# Patient Record
Sex: Female | Born: 1985 | Race: White | Hispanic: No | Marital: Married | Smoking: Never smoker
Health system: Southern US, Community
[De-identification: ages and names within clinical notes are randomized; demographics above are authoritative.]

## PROBLEM LIST (undated history)

## (undated) ENCOUNTER — Inpatient Hospital Stay (HOSPITAL_COMMUNITY): Payer: Self-pay

## (undated) DIAGNOSIS — F419 Anxiety disorder, unspecified: Secondary | ICD-10-CM

## (undated) DIAGNOSIS — F4310 Post-traumatic stress disorder, unspecified: Secondary | ICD-10-CM

## (undated) DIAGNOSIS — I10 Essential (primary) hypertension: Secondary | ICD-10-CM

## (undated) HISTORY — PX: TONSILLECTOMY: SUR1361

## (undated) HISTORY — PX: CHOLECYSTECTOMY: SHX55

## (undated) HISTORY — PX: APPENDECTOMY: SHX54

---

## 2012-10-01 ENCOUNTER — Encounter (HOSPITAL_COMMUNITY): Payer: Self-pay

## 2012-10-01 ENCOUNTER — Inpatient Hospital Stay (HOSPITAL_COMMUNITY)
Admission: AD | Admit: 2012-10-01 | Discharge: 2012-10-01 | Disposition: A | Discharge status: Home / Self Care | Payer: Medicaid - Out of State | Source: Ambulatory Visit | Attending: Obstetrics & Gynecology | Admitting: Obstetrics & Gynecology

## 2012-10-01 ENCOUNTER — Inpatient Hospital Stay (HOSPITAL_COMMUNITY): Payer: Medicaid - Out of State

## 2012-10-01 DIAGNOSIS — R1013 Epigastric pain: Secondary | ICD-10-CM | POA: Insufficient documentation

## 2012-10-01 DIAGNOSIS — O99891 Other specified diseases and conditions complicating pregnancy: Secondary | ICD-10-CM | POA: Insufficient documentation

## 2012-10-01 DIAGNOSIS — R1011 Right upper quadrant pain: Secondary | ICD-10-CM

## 2012-10-01 DIAGNOSIS — O210 Mild hyperemesis gravidarum: Secondary | ICD-10-CM | POA: Insufficient documentation

## 2012-10-01 LAB — COMPREHENSIVE METABOLIC PANEL
CO2: 24 mEq/L (ref 19–32)
Calcium: 10.3 mg/dL (ref 8.4–10.5)
Creatinine, Ser: 0.62 mg/dL (ref 0.50–1.10)
GFR calc Af Amer: >90 mL/min (ref >90)
GFR calc non Af Amer: >90 mL/min (ref >90)
Glucose, Bld: 92 mg/dL (ref 70–99)

## 2012-10-01 LAB — URINALYSIS, ROUTINE W REFLEX MICROSCOPIC
Bilirubin Urine: NEGATIVE
Hgb urine dipstick: NEGATIVE
Protein, ur: NEGATIVE mg/dL
Urobilinogen, UA: 0.2 mg/dL (ref 0.0–1.0)

## 2012-10-01 LAB — CBC
HCT: 32.9 % — ABNORMAL LOW (ref 36.0–46.0)
MCHC: 35.3 g/dL (ref 30.0–36.0)
MCV: 88 fL (ref 78.0–100.0)
RDW: 12.1 % (ref 11.5–15.5)

## 2012-10-01 LAB — URINE MICROSCOPIC-ADD ON

## 2012-10-01 LAB — LIPASE, BLOOD: Lipase: 27 U/L (ref 11–59)

## 2012-10-01 LAB — POCT PREGNANCY, URINE: Preg Test, Ur: POSITIVE — AB

## 2012-10-01 LAB — WET PREP, GENITAL
Clue Cells Wet Prep HPF POC: NONE SEEN
Trich, Wet Prep: NONE SEEN

## 2012-10-01 MED ORDER — GI COCKTAIL ~~LOC~~
30.0000 mL | Freq: Once | ORAL | Status: AC
  Administered 2012-10-01: 30 mL via ORAL
  Filled 2012-10-01: qty 30

## 2012-10-01 MED ORDER — HYDROMORPHONE HCL PF 1 MG/ML IJ SOLN
1.0000 mg | Freq: Once | INTRAMUSCULAR | Status: AC
  Administered 2012-10-01: 1 mg via INTRAMUSCULAR
  Filled 2012-10-01: qty 1

## 2012-10-01 MED ORDER — PROMETHAZINE HCL 25 MG PO TABS
12.5000 mg | ORAL_TABLET | Freq: Once | ORAL | Status: AC
  Administered 2012-10-01: 12.5 mg via ORAL
  Filled 2012-10-01: qty 1

## 2012-10-01 MED ORDER — HYDROCODONE-ACETAMINOPHEN 5-325 MG PO TABS: 1.0000 | ORAL_TABLET | Freq: Four times a day (QID) | ORAL | Status: DC | PRN

## 2012-10-01 NOTE — MAU Note (Signed)
Patient states she has had her first appointment with a MD in [place]. States she started having epigastric pain this am that has gotten worse and radiating into the back. Denies bleeding or discharge. Has had morning sickness that has gotten better. Some nausea with the pain, no vomiting.

## 2012-10-01 NOTE — MAU Note (Signed)
Pt presents with sharp upper abdominal pain since about 9 am.  Pt thought it was maybe indigestion but it was not relieved by tums.  Pt has not thrown up but is on phenergan and remains nauseous.  Pt states pain start in front of abdomen as sharp stabbing pain and wraps around to the back.  Pt able to keep food down but can not finish food.  Pt states pain "wraps around back like a candy cane."

## 2012-10-01 NOTE — MAU Provider Note (Signed)
History     CSN: [id number]  Arrival date and time: 10/01/12 1822   None     Chief Complaint  Patient presents with  . Abdominal Pain   HPI Comments: Gina Baxter 27 y.o. G2P1001 comes to MAU today for right upper quad pain in early pregnancy. She is 9 weeks and 4 days. She states it radiates around to her back. She has been taking tylenol with no help. The pain was intense enough that she had to leave her 43 YOs birthday party. Denies any bleeding, vaginal discharge. She has nausea no vomiting. She has had her new OB visit in [place].       Abdominal Pain Associated symptoms include nausea. Pertinent negatives include no constipation or vomiting.      No past medical history on file.  Past Surgical History  Procedure Laterality Date  . Tonsillectomy    . Appendectomy      No family history on file.  History  Substance Use Topics  . Smoking status: Not on file  . Smokeless tobacco: Not on file  . Alcohol Use: Not on file    Allergies:  Allergies  Allergen Reactions  . Bee Venom Swelling    swelling  . Silicone Swelling    No prescriptions prior to admission    Review of Systems  Constitutional: Negative.        Has pain  HENT: Negative.   Eyes: Negative.   Respiratory: Negative.   Cardiovascular: Negative.   Gastrointestinal: Positive for heartburn, nausea and abdominal pain. Negative for vomiting and constipation.  Genitourinary: Positive for flank pain.       Right flank  Musculoskeletal: Positive for back pain.  Skin: Negative.   Neurological: Negative.   Psychiatric/Behavioral: Negative.    Physical Exam   Blood pressure 132/85, pulse 106, temperature 98.5 F (36.9 C), temperature source Oral, resp. rate 20, height 5\' 6"  (1.676 m), weight 166 lb (75.297 kg), last menstrual period 07/27/2012, SpO2 100.00%.  Physical Exam  Constitutional: She is oriented to person, place, and time. She appears well-developed and well-nourished. She  appears distressed.  HENT:  Head: Normocephalic and atraumatic.  Eyes: Pupils are equal, round, and reactive to light.  Cardiovascular: Normal rate, regular rhythm and normal heart sounds.   Respiratory: Effort normal and breath sounds normal. No respiratory distress. She has no wheezes. She exhibits no tenderness.  GI: Soft. Bowel sounds are normal. There is tenderness.  Genitourinary: Vagina normal. No vaginal discharge found.  Musculoskeletal: Normal range of motion. She exhibits tenderness.  Neurological: She is alert and oriented to person, place, and time.  Skin: Skin is warm and dry.  Psychiatric: She has a normal mood and affect. Her behavior is normal. Judgment and thought content normal.    MAU Course  Procedures  MDM Dilaudid 1 mg IM now CBC, ABORh, U/S, Quant  Results for orders placed during the hospital encounter of 10/01/12 (from the past 24 hour(s))  URINALYSIS, ROUTINE W REFLEX MICROSCOPIC     Status: Abnormal   Collection Time    10/01/12  6:45 PM      Result Value Range   Color, Urine YELLOW  YELLOW   APPearance HAZY (*) CLEAR   Specific Gravity, Urine 1.015  1.005 - 1.030   pH 6.5  5.0 - 8.0   Glucose, UA NEGATIVE  NEGATIVE mg/dL   Hgb urine dipstick NEGATIVE  NEGATIVE   Bilirubin Urine NEGATIVE  NEGATIVE   Ketones, ur NEGATIVE  NEGATIVE mg/dL  Protein, ur NEGATIVE  NEGATIVE mg/dL   Urobilinogen, UA 0.2  0.0 - 1.0 mg/dL   Nitrite NEGATIVE  NEGATIVE   Leukocytes, UA SMALL (*) NEGATIVE  URINE MICROSCOPIC-ADD ON     Status: Abnormal   Collection Time    10/01/12  6:45 PM      Result Value Range   Squamous Epithelial / LPF FEW (*) RARE   WBC, UA 3-6  <3 WBC/hpf   RBC / HPF 0-2  <3 RBC/hpf   Bacteria, UA RARE  RARE   Urine-Other AMORPHOUS URATES/PHOSPHATES    POCT PREGNANCY, URINE     Status: Abnormal   Collection Time    10/01/12  6:48 PM      Result Value Range   Preg Test, Ur POSITIVE (*) NEGATIVE  WET PREP, GENITAL     Status: Abnormal    Collection Time    10/01/12  7:45 PM      Result Value Range   Yeast Wet Prep HPF POC NONE SEEN  NONE SEEN   Trich, Wet Prep NONE SEEN  NONE SEEN   Clue Cells Wet Prep HPF POC NONE SEEN  NONE SEEN   WBC, Wet Prep HPF POC FEW (*) NONE SEEN  CBC     Status: Abnormal   Collection Time    10/01/12  8:05 PM      Result Value Range   WBC 10.3  4.0 - 10.5 K/uL   RBC 3.74 (*) 3.87 - 5.11 MIL/uL   Hemoglobin 11.6 (*) 12.0 - 15.0 g/dL   HCT 16.1 (*) 09.6 - 04.5 %   MCV 88.0  78.0 - 100.0 fL   MCH 31.0  26.0 - 34.0 pg   MCHC 35.3  30.0 - 36.0 g/dL   RDW 40.9  81.1 - 91.4 %   Platelets 292  150 - 400 K/uL  HCG, QUANTITATIVE, PREGNANCY     Status: Abnormal   Collection Time    10/01/12  8:05 PM      Result Value Range   hCG, Beta Francene Finders [id number] (*) <5 mIU/mL  ABO/RH     Status: None   Collection Time    10/01/12  8:05 PM      Result Value Range   ABO/RH(D) O POS    COMPREHENSIVE METABOLIC PANEL     Status: Abnormal   Collection Time    10/01/12  8:05 PM      Result Value Range   Sodium 137  135 - 145 mEq/L   Potassium 4.2  3.5 - 5.1 mEq/L   Chloride 101  96 - 112 mEq/L   CO2 24  19 - 32 mEq/L   Glucose, Bld 92  70 - 99 mg/dL   BUN 15  6 - 23 mg/dL   Creatinine, Ser 2.13  0.50 - 1.10 mg/dL   Calcium 08.6  8.4 - 57.8 mg/dL   Total Protein 7.0  6.0 - 8.3 g/dL   Albumin 3.9  3.5 - 5.2 g/dL   AST 18  0 - 37 U/L   ALT 25  0 - 35 U/L   Alkaline Phosphatase 76  39 - 117 U/L   Total Bilirubin 0.1 (*) 0.3 - 1.2 mg/dL   GFR calc non Af Amer >90  >90 mL/min   GFR calc Af Amer >90  >90 mL/min  AMYLASE     Status: None   Collection Time    10/01/12  8:05 PM      Result Value  Range   Amylase 94  0 - 105 U/L  LIPASE, BLOOD     Status: None   Collection Time    10/01/12  8:05 PM      Result Value Range   Lipase 27  11 - 59 U/L   US Ob Comp Less 14 Wks  10/01/2012   CLINICAL DATA:  Right upper quadrant pain.  EXAM: OBSTETRIC <14 WK ULTRASOUND  TECHNIQUE: Transabdominal  ultrasound was performed for evaluation of the gestation as well as the maternal uterus and adnexal regions.  COMPARISON:  No priors.  FINDINGS: Intrauterine gestational sac: Single, ovoid in shape.  Yolk sac:  Present.  Embryo:  Present.  Cardiac Activity: Present.  Heart Rate: 169 bpm  CRL:   1.93  mm   8 w 4 d                  [place] EDC: 05/09/2013.  Maternal uterus/adnexae: No evidence to suggest subchorionic hemorrhage. No focal uterine lesions. Ovaries are normal in echotexture and appearance bilaterally. No significant free fluid in the cul-de-sac.  IMPRESSION: 1. Single viable IUP with estimated gestational age of [redacted] weeks and 4 days, and normal fetal heart rate of 169 beats per min. No acute findings.   Electronically Signed   By: Trudie Reed M.D.   On: 10/01/2012 21:44   C/W Dr. Despina Hidden, plan to FU with primary OBGYN and consider outpatient abdominal US as needed or if pain persists.    Assessment and Plan   A: . 1. RUQ abdominal pain      P:  Care transferred to Thressa Sheller, NMW at 8 pm D/C home with vicodin #15 0RF FU with primary OB as needed Return to MAU as needed 1st trimester precautions reviewed  Carolynn Serve 10/01/2012, 7:30 PM   Tawnya Crook

## 2013-08-06 ENCOUNTER — Encounter (HOSPITAL_COMMUNITY): Payer: Self-pay | Admitting: *Deleted

## 2013-11-19 ENCOUNTER — Encounter (HOSPITAL_COMMUNITY): Payer: Self-pay | Admitting: *Deleted

## 2015-04-26 DIAGNOSIS — F4310 Post-traumatic stress disorder, unspecified: Secondary | ICD-10-CM | POA: Diagnosis not present

## 2015-04-26 DIAGNOSIS — Z79899 Other long term (current) drug therapy: Secondary | ICD-10-CM | POA: Diagnosis not present

## 2015-04-26 DIAGNOSIS — R110 Nausea: Secondary | ICD-10-CM | POA: Diagnosis not present

## 2015-04-26 DIAGNOSIS — F419 Anxiety disorder, unspecified: Secondary | ICD-10-CM | POA: Diagnosis not present

## 2015-04-26 DIAGNOSIS — R509 Fever, unspecified: Secondary | ICD-10-CM | POA: Diagnosis not present

## 2015-04-26 DIAGNOSIS — F329 Major depressive disorder, single episode, unspecified: Secondary | ICD-10-CM | POA: Diagnosis not present

## 2015-04-26 DIAGNOSIS — N83201 Unspecified ovarian cyst, right side: Secondary | ICD-10-CM | POA: Diagnosis not present

## 2015-04-26 DIAGNOSIS — I10 Essential (primary) hypertension: Secondary | ICD-10-CM | POA: Diagnosis not present

## 2015-04-26 DIAGNOSIS — R1031 Right lower quadrant pain: Secondary | ICD-10-CM | POA: Diagnosis not present

## 2015-04-26 DIAGNOSIS — Z87891 Personal history of nicotine dependence: Secondary | ICD-10-CM | POA: Diagnosis not present

## 2015-04-27 DIAGNOSIS — R110 Nausea: Secondary | ICD-10-CM | POA: Diagnosis not present

## 2015-04-27 DIAGNOSIS — N83201 Unspecified ovarian cyst, right side: Secondary | ICD-10-CM | POA: Diagnosis not present

## 2015-04-27 DIAGNOSIS — R509 Fever, unspecified: Secondary | ICD-10-CM | POA: Diagnosis not present

## 2015-04-27 DIAGNOSIS — R1031 Right lower quadrant pain: Secondary | ICD-10-CM | POA: Diagnosis not present

## 2015-04-28 DIAGNOSIS — F411 Generalized anxiety disorder: Secondary | ICD-10-CM | POA: Diagnosis not present

## 2015-04-28 DIAGNOSIS — F331 Major depressive disorder, recurrent, moderate: Secondary | ICD-10-CM | POA: Diagnosis not present

## 2015-04-28 DIAGNOSIS — F428 Other obsessive-compulsive disorder: Secondary | ICD-10-CM | POA: Diagnosis not present

## 2015-04-28 DIAGNOSIS — F4001 Agoraphobia with panic disorder: Secondary | ICD-10-CM | POA: Diagnosis not present

## 2015-04-30 DIAGNOSIS — N83201 Unspecified ovarian cyst, right side: Secondary | ICD-10-CM | POA: Diagnosis not present

## 2015-05-09 DIAGNOSIS — R05 Cough: Secondary | ICD-10-CM | POA: Diagnosis not present

## 2015-05-09 DIAGNOSIS — J028 Acute pharyngitis due to other specified organisms: Secondary | ICD-10-CM | POA: Diagnosis not present

## 2015-05-22 DIAGNOSIS — J019 Acute sinusitis, unspecified: Secondary | ICD-10-CM | POA: Diagnosis not present

## 2015-05-22 DIAGNOSIS — M545 Low back pain: Secondary | ICD-10-CM | POA: Diagnosis not present

## 2015-06-08 DIAGNOSIS — J069 Acute upper respiratory infection, unspecified: Secondary | ICD-10-CM | POA: Diagnosis not present

## 2015-06-13 DIAGNOSIS — M5441 Lumbago with sciatica, right side: Secondary | ICD-10-CM | POA: Diagnosis not present

## 2015-07-07 DIAGNOSIS — I10 Essential (primary) hypertension: Secondary | ICD-10-CM | POA: Diagnosis not present

## 2015-07-07 DIAGNOSIS — J019 Acute sinusitis, unspecified: Secondary | ICD-10-CM | POA: Diagnosis not present

## 2015-07-14 DIAGNOSIS — R0600 Dyspnea, unspecified: Secondary | ICD-10-CM | POA: Diagnosis not present

## 2015-07-14 DIAGNOSIS — I10 Essential (primary) hypertension: Secondary | ICD-10-CM | POA: Diagnosis not present

## 2015-07-14 DIAGNOSIS — J9801 Acute bronchospasm: Secondary | ICD-10-CM | POA: Diagnosis not present

## 2015-07-14 DIAGNOSIS — J329 Chronic sinusitis, unspecified: Secondary | ICD-10-CM | POA: Diagnosis not present

## 2015-08-04 DIAGNOSIS — F411 Generalized anxiety disorder: Secondary | ICD-10-CM | POA: Diagnosis not present

## 2015-08-04 DIAGNOSIS — F428 Other obsessive-compulsive disorder: Secondary | ICD-10-CM | POA: Diagnosis not present

## 2015-08-04 DIAGNOSIS — F331 Major depressive disorder, recurrent, moderate: Secondary | ICD-10-CM | POA: Diagnosis not present

## 2015-08-04 DIAGNOSIS — F4001 Agoraphobia with panic disorder: Secondary | ICD-10-CM | POA: Diagnosis not present

## 2015-08-05 DIAGNOSIS — I10 Essential (primary) hypertension: Secondary | ICD-10-CM | POA: Diagnosis not present

## 2015-08-05 DIAGNOSIS — J0110 Acute frontal sinusitis, unspecified: Secondary | ICD-10-CM | POA: Diagnosis not present

## 2015-08-05 DIAGNOSIS — R05 Cough: Secondary | ICD-10-CM | POA: Diagnosis not present

## 2015-08-07 DIAGNOSIS — R21 Rash and other nonspecific skin eruption: Secondary | ICD-10-CM | POA: Diagnosis not present

## 2015-08-07 DIAGNOSIS — R030 Elevated blood-pressure reading, without diagnosis of hypertension: Secondary | ICD-10-CM | POA: Diagnosis not present

## 2015-08-07 DIAGNOSIS — L500 Allergic urticaria: Secondary | ICD-10-CM | POA: Diagnosis not present

## 2015-08-22 DIAGNOSIS — F4001 Agoraphobia with panic disorder: Secondary | ICD-10-CM | POA: Diagnosis not present

## 2015-08-22 DIAGNOSIS — F331 Major depressive disorder, recurrent, moderate: Secondary | ICD-10-CM | POA: Diagnosis not present

## 2015-08-22 DIAGNOSIS — F411 Generalized anxiety disorder: Secondary | ICD-10-CM | POA: Diagnosis not present

## 2015-08-22 DIAGNOSIS — F428 Other obsessive-compulsive disorder: Secondary | ICD-10-CM | POA: Diagnosis not present

## 2015-09-07 DIAGNOSIS — R05 Cough: Secondary | ICD-10-CM | POA: Diagnosis not present

## 2015-09-07 DIAGNOSIS — J019 Acute sinusitis, unspecified: Secondary | ICD-10-CM | POA: Diagnosis not present

## 2015-09-07 DIAGNOSIS — J3089 Other allergic rhinitis: Secondary | ICD-10-CM | POA: Diagnosis not present

## 2015-09-23 DIAGNOSIS — R509 Fever, unspecified: Secondary | ICD-10-CM | POA: Diagnosis not present

## 2015-09-23 DIAGNOSIS — R062 Wheezing: Secondary | ICD-10-CM | POA: Diagnosis not present

## 2015-09-23 DIAGNOSIS — J40 Bronchitis, not specified as acute or chronic: Secondary | ICD-10-CM | POA: Diagnosis not present

## 2015-09-23 DIAGNOSIS — R05 Cough: Secondary | ICD-10-CM | POA: Diagnosis not present

## 2015-10-28 DIAGNOSIS — F411 Generalized anxiety disorder: Secondary | ICD-10-CM | POA: Diagnosis not present

## 2015-10-28 DIAGNOSIS — F331 Major depressive disorder, recurrent, moderate: Secondary | ICD-10-CM | POA: Diagnosis not present

## 2015-10-28 DIAGNOSIS — F4001 Agoraphobia with panic disorder: Secondary | ICD-10-CM | POA: Diagnosis not present

## 2015-10-28 DIAGNOSIS — F428 Other obsessive-compulsive disorder: Secondary | ICD-10-CM | POA: Diagnosis not present

## 2015-11-01 ENCOUNTER — Encounter (HOSPITAL_BASED_OUTPATIENT_CLINIC_OR_DEPARTMENT_OTHER): Payer: Self-pay | Admitting: Emergency Medicine

## 2015-11-01 ENCOUNTER — Emergency Department (HOSPITAL_BASED_OUTPATIENT_CLINIC_OR_DEPARTMENT_OTHER)
Admission: EM | Admit: 2015-11-01 | Discharge: 2015-11-01 | Disposition: A | Discharge status: Home / Self Care | Payer: BLUE CROSS/BLUE SHIELD | Attending: Emergency Medicine | Admitting: Emergency Medicine

## 2015-11-01 DIAGNOSIS — Z23 Encounter for immunization: Secondary | ICD-10-CM | POA: Diagnosis not present

## 2015-11-01 DIAGNOSIS — Y999 Unspecified external cause status: Secondary | ICD-10-CM | POA: Diagnosis not present

## 2015-11-01 DIAGNOSIS — Y9389 Activity, other specified: Secondary | ICD-10-CM | POA: Diagnosis not present

## 2015-11-01 DIAGNOSIS — W260XXA Contact with knife, initial encounter: Secondary | ICD-10-CM | POA: Insufficient documentation

## 2015-11-01 DIAGNOSIS — S6992XA Unspecified injury of left wrist, hand and finger(s), initial encounter: Secondary | ICD-10-CM | POA: Diagnosis present

## 2015-11-01 DIAGNOSIS — Y929 Unspecified place or not applicable: Secondary | ICD-10-CM | POA: Diagnosis not present

## 2015-11-01 DIAGNOSIS — I10 Essential (primary) hypertension: Secondary | ICD-10-CM | POA: Insufficient documentation

## 2015-11-01 DIAGNOSIS — S61211A Laceration without foreign body of left index finger without damage to nail, initial encounter: Secondary | ICD-10-CM | POA: Diagnosis not present

## 2015-11-01 HISTORY — DX: Essential (primary) hypertension: I10

## 2015-11-01 HISTORY — DX: Anxiety disorder, unspecified: F41.9

## 2015-11-01 HISTORY — DX: Post-traumatic stress disorder, unspecified: F43.10

## 2015-11-01 MED ORDER — TETANUS-DIPHTH-ACELL PERTUSSIS 5-2.5-18.5 LF-MCG/0.5 IM SUSP
0.5000 mL | Freq: Once | INTRAMUSCULAR | Status: AC
  Administered 2015-11-01: 0.5 mL via INTRAMUSCULAR
  Filled 2015-11-01: qty 0.5

## 2015-11-01 MED ORDER — LIDOCAINE HCL (PF) 1 % IJ SOLN
5.0000 mL | Freq: Once | INTRAMUSCULAR | Status: AC
  Administered 2015-11-01: 5 mL via INTRADERMAL
  Filled 2015-11-01: qty 5

## 2015-11-01 NOTE — ED Provider Notes (Signed)
[facility] Provider Note   CSN: [id number] Arrival date & time: 11/01/15  0021     History   Chief Complaint Chief Complaint  Patient presents with  . Laceration    HPI Gina Baxter is a 30 y.o. female.  The history is provided by the patient and medical records.  Laceration     30 y.o. F with hx of HTN and anxiety, presenting to the ED for left index finger laceration from knife.  Patient states she was cutting up food when the knife slipped and accidentally cut her finger.  Denies any numbness or tingling of affected finger. States she can still flex and extend her finger without difficulty, however it is painful. Patient is right hand dominant.  Date of last tetanus unknown.  Past Medical History:  Diagnosis Date  . Anxiety   . Hypertension   . PTSD (post-traumatic stress disorder)     There are no active problems to display for this patient.   Past Surgical History:  Procedure Laterality Date  . APPENDECTOMY    . CHOLECYSTECTOMY    . TONSILLECTOMY      OB History    Gravida Para Term Preterm AB Living   2 1 1  0 0 1   SAB TAB Ectopic Multiple Live Births   0 0 0 0         Home Medications    Prior to Admission medications   Medication Sig Start Date End Date Taking? Authorizing Provider  HYDROcodone-acetaminophen (NORCO/VICODIN) 5-325 MG per tablet Take 1-2 tablets by mouth every 6 (six) hours as needed for pain. 10/01/12   Armando ReichertHeather D Hogan, CNM    Family History No family history on file.  Social History Social History  Substance Use Topics  . Smoking status: Never Smoker  . Smokeless tobacco: Never Used  . Alcohol use Yes     Allergies   Bee venom and Silicone   Review of Systems Review of Systems  Skin: Positive for wound.  All other systems reviewed and are negative.    Physical Exam Updated Vital Signs BP (!) 158/103 (BP Location: Right Arm)   Pulse 86   Temp 97.9 F (36.6 C) (Oral)   Resp 18   Ht 5\' 7"  (1.702  m)   Wt 77.1 kg   SpO2 100%   BMI 26.63 kg/m   Physical Exam  Constitutional: She is oriented to person, place, and time. She appears well-developed and well-nourished.  HENT:  Head: Normocephalic and atraumatic.  Mouth/Throat: Oropharynx is clear and moist.  Eyes: Conjunctivae and EOM are normal. Pupils are equal, round, and reactive to light.  Neck: Normal range of motion.  Cardiovascular: Normal rate, regular rhythm and normal heart sounds.   Pulmonary/Chest: Effort normal and breath sounds normal.  Abdominal: Soft. Bowel sounds are normal.  Musculoskeletal: Normal range of motion.  1.5 cm laceration to pad of distal left index finger; wound is overall hemostatic; full flexion/extension of finger maintained; no swelling or bony deformities; normal sensation, normal cap refill  Neurological: She is alert and oriented to person, place, and time.  Skin: Skin is warm and dry.  Psychiatric: She has a normal mood and affect.  Nursing note and vitals reviewed.    ED Treatments / Results  Labs (all labs ordered are listed, but only abnormal results are displayed) Labs Reviewed - No data to display  EKG  EKG Interpretation None       Radiology No results found.  Procedures  Procedures (including critical care time)  LACERATION REPAIR Performed by: Garlon Hatchet Authorized by: Garlon Hatchet Consent: Verbal consent obtained. Risks and benefits: risks, benefits and alternatives were discussed Consent given by: patient Patient identity confirmed: provided demographic data Prepped and Draped in normal sterile fashion Wound explored  Laceration Location: left index finger  Laceration Length: 1.5 cm  No Foreign Bodies seen or palpated  Anesthesia: local infiltration  Local anesthetic: lidocaine 1% without epinephrine  Anesthetic total: 3 ml  Irrigation method: syringe Amount of cleaning: standard  Skin closure: 4-0 prolene  Number of sutures:  2  Technique: simple interrupted  Patient tolerance: Patient tolerated the procedure well with no immediate complications.   Medications Ordered in ED Medications - No data to display   Initial Impression / Assessment and Plan / ED Course  I have reviewed the triage vital signs and the nursing notes.  Pertinent labs & imaging results that were available during my care of the patient were reviewed by me and considered in my medical decision making (see chart for details).  Clinical Course   30 year old female here with laceration to left index finger from a steak knife. Bleeding is controlled on arrival. She maintains full flexion and extension of affected finger without any apparent tendon injury. She has normal sensation and cap refill. Tetanus updated. Laceration repaired as above, patient tolerated well. Discussed home wound care. She will follow-up in one week for suture removal.  Discussed plan with patient, she acknowledged understanding and agreed with plan of care.  Return precautions given for new or worsening symptoms.  Final Clinical Impressions(s) / ED Diagnoses   Final diagnoses:  Laceration of left index finger without foreign body without damage to nail, initial encounter    New Prescriptions New Prescriptions   No medications on file     Garlon Hatchet, PA-C 11/01/15 0058    Layla Maw Ward, DO 11/01/15 1610

## 2015-11-01 NOTE — ED Notes (Signed)
PA at bedside.

## 2015-11-01 NOTE — ED Notes (Signed)
PA at bedside for lac repair.

## 2015-11-01 NOTE — ED Triage Notes (Signed)
Pt has laceration to left index finger from steak knife.

## 2015-11-01 NOTE — Discharge Instructions (Signed)
Keep sutures clean and dry. Follow-up for suture removal in 1 week. Return here for any new concerns.

## 2015-11-25 DIAGNOSIS — F411 Generalized anxiety disorder: Secondary | ICD-10-CM | POA: Diagnosis not present

## 2015-11-25 DIAGNOSIS — F428 Other obsessive-compulsive disorder: Secondary | ICD-10-CM | POA: Diagnosis not present

## 2015-11-25 DIAGNOSIS — F4001 Agoraphobia with panic disorder: Secondary | ICD-10-CM | POA: Diagnosis not present

## 2015-11-25 DIAGNOSIS — F331 Major depressive disorder, recurrent, moderate: Secondary | ICD-10-CM | POA: Diagnosis not present

## 2015-12-08 DIAGNOSIS — H6690 Otitis media, unspecified, unspecified ear: Secondary | ICD-10-CM | POA: Diagnosis not present

## 2015-12-08 DIAGNOSIS — J209 Acute bronchitis, unspecified: Secondary | ICD-10-CM | POA: Diagnosis not present

## 2015-12-08 DIAGNOSIS — I10 Essential (primary) hypertension: Secondary | ICD-10-CM | POA: Diagnosis not present

## 2015-12-18 DIAGNOSIS — F428 Other obsessive-compulsive disorder: Secondary | ICD-10-CM | POA: Diagnosis not present

## 2015-12-18 DIAGNOSIS — F411 Generalized anxiety disorder: Secondary | ICD-10-CM | POA: Diagnosis not present

## 2015-12-18 DIAGNOSIS — F4001 Agoraphobia with panic disorder: Secondary | ICD-10-CM | POA: Diagnosis not present

## 2015-12-18 DIAGNOSIS — F331 Major depressive disorder, recurrent, moderate: Secondary | ICD-10-CM | POA: Diagnosis not present

## 2016-01-05 DIAGNOSIS — F331 Major depressive disorder, recurrent, moderate: Secondary | ICD-10-CM | POA: Diagnosis not present

## 2016-01-05 DIAGNOSIS — F411 Generalized anxiety disorder: Secondary | ICD-10-CM | POA: Diagnosis not present

## 2016-01-05 DIAGNOSIS — F4001 Agoraphobia with panic disorder: Secondary | ICD-10-CM | POA: Diagnosis not present

## 2016-01-05 DIAGNOSIS — F428 Other obsessive-compulsive disorder: Secondary | ICD-10-CM | POA: Diagnosis not present

## 2016-02-07 ENCOUNTER — Emergency Department (HOSPITAL_BASED_OUTPATIENT_CLINIC_OR_DEPARTMENT_OTHER)
Admission: EM | Admit: 2016-02-07 | Discharge: 2016-02-08 | Disposition: A | Discharge status: Home / Self Care | Payer: BLUE CROSS/BLUE SHIELD | Attending: Emergency Medicine | Admitting: Emergency Medicine

## 2016-02-07 ENCOUNTER — Emergency Department (HOSPITAL_BASED_OUTPATIENT_CLINIC_OR_DEPARTMENT_OTHER): Payer: BLUE CROSS/BLUE SHIELD

## 2016-02-07 ENCOUNTER — Encounter (HOSPITAL_BASED_OUTPATIENT_CLINIC_OR_DEPARTMENT_OTHER): Payer: Self-pay | Admitting: Emergency Medicine

## 2016-02-07 DIAGNOSIS — I10 Essential (primary) hypertension: Secondary | ICD-10-CM | POA: Insufficient documentation

## 2016-02-07 DIAGNOSIS — K219 Gastro-esophageal reflux disease without esophagitis: Secondary | ICD-10-CM | POA: Diagnosis not present

## 2016-02-07 DIAGNOSIS — F419 Anxiety disorder, unspecified: Secondary | ICD-10-CM | POA: Diagnosis not present

## 2016-02-07 DIAGNOSIS — R079 Chest pain, unspecified: Secondary | ICD-10-CM | POA: Diagnosis not present

## 2016-02-07 DIAGNOSIS — F411 Generalized anxiety disorder: Secondary | ICD-10-CM | POA: Diagnosis not present

## 2016-02-07 DIAGNOSIS — Z79899 Other long term (current) drug therapy: Secondary | ICD-10-CM | POA: Diagnosis not present

## 2016-02-07 DIAGNOSIS — R072 Precordial pain: Secondary | ICD-10-CM | POA: Diagnosis present

## 2016-02-07 LAB — CBC
HCT: 34.8 % — ABNORMAL LOW (ref 36.0–46.0)
Hemoglobin: 11.4 g/dL — ABNORMAL LOW (ref 12.0–15.0)
MCH: 30.9 pg (ref 26.0–34.0)
MCHC: 32.8 g/dL (ref 30.0–36.0)
MCV: 94.3 fL (ref 78.0–100.0)
PLATELETS: 416 10*3/uL — AB (ref 150–400)
RBC: 3.69 MIL/uL — AB (ref 3.87–5.11)
RDW: 12.9 % (ref 11.5–15.5)
WBC: 7.6 10*3/uL (ref 4.0–10.5)

## 2016-02-07 LAB — BASIC METABOLIC PANEL
Anion gap: 4 — ABNORMAL LOW (ref 5–15)
BUN: 19 mg/dL (ref 6–20)
CALCIUM: 9.3 mg/dL (ref 8.9–10.3)
CHLORIDE: 107 mmol/L (ref 101–111)
CO2: 23 mmol/L (ref 22–32)
CREATININE: 0.63 mg/dL (ref 0.44–1.00)
GFR calc Af Amer: >60 mL/min (ref >60)
GFR calc non Af Amer: >60 mL/min (ref >60)
Glucose, Bld: 97 mg/dL (ref 65–99)
Potassium: 3.7 mmol/L (ref 3.5–5.1)
SODIUM: 134 mmol/L — AB (ref 135–145)

## 2016-02-07 LAB — TROPONIN I: Troponin I: <0.03 ng/mL (ref <0.03)

## 2016-02-07 MED ORDER — GI COCKTAIL ~~LOC~~
30.0000 mL | Freq: Once | ORAL | Status: AC
  Administered 2016-02-07: 30 mL via ORAL
  Filled 2016-02-07: qty 30

## 2016-02-07 NOTE — ED Provider Notes (Signed)
[facility] Note   CSN: [id number] Arrival date & time: 02/07/16  1900  By signing my name below, I, Doreatha Martin, attest that this documentation has been prepared under the direction and in the presence of Samarth Ogle, MD. Electronically Signed: Doreatha Martin, ED Scribe. 02/07/16. 11:46 PM.     History   Chief Complaint Chief Complaint  Patient presents with  . Chest Pain    HPI Gina Baxter is a 31 y.o. female who presents to the Emergency Department complaining of moderate, burning, sharp substernal and epigastric chest pain that began this evening. Pt states she was getting ready for dinner when she suddenly began to have intermittent sharp pains in her mid chest, subsequently becoming anxious. No alleviating factors noted. She states drinking fluids worsens her pain. Pt states the last thing she ate before her pain began was a chicken casserole that did not contain any cheese or heavy sauces. No recent long travel or surgeries. No h/o cardiac disease. She denies SOB, additional complaints or concerns.   The history is provided by the patient. No language interpreter was used.  Chest Pain   This is a new problem. The current episode started 3 to 5 hours ago. The problem occurs constantly. The problem has not changed since onset.The pain is associated with an emotional upset. The pain is present in the substernal region. The pain is moderate. The quality of the pain is described as burning and sharp. The pain does not radiate. Pertinent negatives include no abdominal pain, no back pain, no cough, no diaphoresis, no palpitations and no shortness of breath. She has tried nothing for the symptoms. The treatment provided no relief. There are no known risk factors.  Her past medical history is significant for anxiety/panic attacks.  Pertinent negatives for past medical history include no congenital heart disease, no CHF, no sickle cell disease, no spontaneous pneumothorax  and no valve disorder.  Pertinent negatives for family medical history include: no aortic dissection.  Procedure history is negative for cardiac catheterization.    Past Medical History:  Diagnosis Date  . Anxiety   . Hypertension   . PTSD (post-traumatic stress disorder)     There are no active problems to display for this patient.   Past Surgical History:  Procedure Laterality Date  . APPENDECTOMY    . CHOLECYSTECTOMY    . TONSILLECTOMY      OB History    Gravida Para Term Preterm AB Living   2 1 1  0 0 1   SAB TAB Ectopic Multiple Live Births   0 0 0 0         Home Medications    Prior to Admission medications   Medication Sig Start Date End Date Taking? Authorizing Provider  amphetamine-dextroamphetamine (ADDERALL) 20 MG tablet Take 20 mg by mouth daily.   Yes Historical Provider, MD  busPIRone (BUSPAR) 30 MG tablet Take 30 mg by mouth 2 (two) times daily.   Yes Historical Provider, MD  FLUoxetine (PROZAC) 40 MG capsule Take 40 mg by mouth daily.   Yes Historical Provider, MD  olmesartan (BENICAR) 20 MG tablet Take 20 mg by mouth daily.   Yes Historical Provider, MD  HYDROcodone-acetaminophen (NORCO/VICODIN) 5-325 MG per tablet Take 1-2 tablets by mouth every 6 (six) hours as needed for pain. 10/01/12   Armando Reichert, CNM    Family History History reviewed. No pertinent family history.  Social History Social History  Substance Use Topics  . Smoking  status: Never Smoker  . Smokeless tobacco: Never Used  . Alcohol use Yes     Allergies   Bee venom and Silicone   Review of Systems Review of Systems  Constitutional: Negative for diaphoresis.  Respiratory: Negative for cough, chest tightness and shortness of breath.   Cardiovascular: Positive for chest pain. Negative for palpitations and leg swelling.  Gastrointestinal: Negative for abdominal pain.  Musculoskeletal: Negative for back pain.  Psychiatric/Behavioral: The patient is nervous/anxious.   All  other systems reviewed and are negative.    Physical Exam Updated Vital Signs BP (!) 156/109   Pulse 89   Temp 98 F (36.7 C) (Oral)   Resp 18   Ht 5\' 7"  (1.702 m)   Wt 175 lb (79.4 kg)   LMP 01/24/2016   SpO2 98%   BMI 27.41 kg/m   Physical Exam  Constitutional: She is oriented to person, place, and time. She appears well-developed and well-nourished.  HENT:  Head: Normocephalic and atraumatic.  Mouth/Throat: Oropharynx is clear and moist. No oropharyngeal exudate.  Moist mucous membranes. No exudates.   Eyes: Conjunctivae and EOM are normal. Pupils are equal, round, and reactive to light.  Neck: Normal range of motion. Neck supple. No JVD present. No tracheal deviation present.  No carotid bruits. Trachea midline.   Cardiovascular: Normal rate, regular rhythm, normal heart sounds and intact distal pulses.  Exam reveals no gallop and no friction rub.   No murmur heard. RRR.   Pulmonary/Chest: Effort normal and breath sounds normal. No stridor. No respiratory distress. She has no wheezes. She has no rales.  Lungs CTA bilaterally.   Abdominal: Soft. She exhibits no distension. There is no rebound and no guarding.  Extremely gassy with bowel sounds referred into thoracic cavity.   Musculoskeletal: Normal range of motion. She exhibits no edema.  All compartments soft. No edema. No cords.   Lymphadenopathy:    She has no cervical adenopathy.  Neurological: She is alert and oriented to person, place, and time. She has normal reflexes.  Skin: Skin is warm and dry. Capillary refill takes less than 2 seconds.  Psychiatric: Her mood appears anxious.  Pt is anxious, tearful and crying in the room   Nursing note and vitals reviewed.    ED Treatments / Results   DIAGNOSTIC STUDIES: Oxygen Saturation is 97% on RA, normal by my interpretation.    COORDINATION OF CARE: 11:14 PM Discussed treatment plan with pt at bedside which includes CXR, lab work and pt agreed to plan.     Labs (all labs ordered are listed, but only abnormal  Results for orders placed or performed during the hospital encounter of 02/07/16  Basic metabolic panel  Result Value Ref Range   Sodium 134 (L) 135 - 145 mmol/L   Potassium 3.7 3.5 - 5.1 mmol/L   Chloride 107 101 - 111 mmol/L   CO2 23 22 - 32 mmol/L   Glucose, Bld 97 65 - 99 mg/dL   BUN 19 6 - 20 mg/dL   Creatinine, Ser 4.090.63 0.44 - 1.00 mg/dL   Calcium 9.3 8.9 - 81.110.3 mg/dL   GFR calc non Af Amer >60 >60 mL/min   GFR calc Af Amer >60 >60 mL/min   Anion gap 4 (L) 5 - 15  CBC  Result Value Ref Range   WBC 7.6 4.0 - 10.5 K/uL   RBC 3.69 (L) 3.87 - 5.11 MIL/uL   Hemoglobin 11.4 (L) 12.0 - 15.0 g/dL   HCT 91.434.8 (L)  36.0 - 46.0 %   MCV 94.3 78.0 - 100.0 fL   MCH 30.9 26.0 - 34.0 pg   MCHC 32.8 30.0 - 36.0 g/dL   RDW 16.1 09.6 - 04.5 %   Platelets 416 (H) 150 - 400 K/uL  Troponin I  Result Value Ref Range   Troponin I <0.03 <0.03 ng/mL   Dg Chest 2 View  Result Date: 02/07/2016 CLINICAL DATA:  Acute onset stabbing chest pain at 1700 hrs today. Slight headache. Ex smoker. Htn. No diab. Hx bronchitis. EXAM: CHEST  2 VIEW COMPARISON:  None. FINDINGS: Normal mediastinum and cardiac silhouette. Normal pulmonary vasculature. No evidence of effusion, infiltrate, or pneumothorax. No acute bony abnormality. IMPRESSION: No acute cardiopulmonary process. Electronically Signed   By: Genevive Bi M.D.   On: 02/07/2016 20:26   EKG  EKG Interpretation  Date/Time:  Saturday February 07 2016 19:17:49 EST Ventricular Rate:  89 PR Interval:  134 QRS Duration: 82 QT Interval:  346 QTC Calculation: 420 R Axis:   16 Text Interpretation:  Normal sinus rhythm Normal ECG No old tracing to compare Confirmed by BELFI  MD, MELANIE ([id number]) on 02/07/2016 8:57:08 PM       Radiology Dg Chest 2 View  Result Date: 02/07/2016 CLINICAL DATA:  Acute onset stabbing chest pain at 1700 hrs today. Slight headache. Ex smoker. Htn. No diab. Hx bronchitis.  EXAM: CHEST  2 VIEW COMPARISON:  None. FINDINGS: Normal mediastinum and cardiac silhouette. Normal pulmonary vasculature. No evidence of effusion, infiltrate, or pneumothorax. No acute bony abnormality. IMPRESSION: No acute cardiopulmonary process. Electronically Signed   By: Genevive Bi M.D.   On: 02/07/2016 20:26    Procedures Procedures (including critical care time)  Medications Ordered in ED Medications  gi cocktail (Maalox,Lidocaine,Donnatal) (30 mLs Oral Given 02/07/16 2317)  LORazepam (ATIVAN) tablet 0.5 mg (0.5 mg Oral Given 02/08/16 0050)  ketorolac (TORADOL) injection 60 mg (60 mg Intramuscular Given 02/08/16 0050)       Anxiety.  Symptoms resolved with ativan. PERC negative. Wells zero. Highly doubt PE in this very low risk patient.  HEART score is 0. All questions answered to patient's satisfaction. Based on history and exam patient has been appropriately medically screened and emergency conditions excluded. Patient is stable for discharge at this time. Strict return precautions given for any further episodes, persistent fever, weakness or any concerns.  New Prescriptions New Prescriptions   No medications on file   I personally performed the services described in this documentation, which was scribed in my presence. The recorded information has been reviewed and is accurate.      Cy Blamer, MD 02/08/16 [phone]

## 2016-02-07 NOTE — ED Triage Notes (Signed)
Patient states that she was fixing her hair to go out tonight and she started to have chest pain in her midsternal chest. The patient is very anxious and tearful in triage. The patient reports that taking a deep breath hurts as well as sitting still.

## 2016-02-08 ENCOUNTER — Encounter (HOSPITAL_BASED_OUTPATIENT_CLINIC_OR_DEPARTMENT_OTHER): Payer: Self-pay | Admitting: Emergency Medicine

## 2016-02-08 MED ORDER — LORAZEPAM 1 MG PO TABS
0.5000 mg | ORAL_TABLET | Freq: Once | ORAL | Status: AC
  Administered 2016-02-08: 0.5 mg via ORAL
  Filled 2016-02-08: qty 1

## 2016-02-08 MED ORDER — KETOROLAC TROMETHAMINE 60 MG/2ML IM SOLN
60.0000 mg | Freq: Once | INTRAMUSCULAR | Status: AC
  Administered 2016-02-08: 60 mg via INTRAMUSCULAR
  Filled 2016-02-08: qty 2

## 2016-02-08 NOTE — ED Notes (Signed)
Pt given d/c instructions as per chart. Verbalizes understanding. No questions. 

## 2016-03-10 DIAGNOSIS — F411 Generalized anxiety disorder: Secondary | ICD-10-CM | POA: Diagnosis not present

## 2016-03-10 DIAGNOSIS — F3342 Major depressive disorder, recurrent, in full remission: Secondary | ICD-10-CM | POA: Diagnosis not present

## 2016-03-10 DIAGNOSIS — F900 Attention-deficit hyperactivity disorder, predominantly inattentive type: Secondary | ICD-10-CM | POA: Diagnosis not present

## 2016-03-18 ENCOUNTER — Encounter: Payer: Self-pay | Admitting: Family Medicine

## 2016-03-18 ENCOUNTER — Ambulatory Visit (INDEPENDENT_AMBULATORY_CARE_PROVIDER_SITE_OTHER): Payer: BLUE CROSS/BLUE SHIELD | Admitting: Family Medicine

## 2016-03-18 DIAGNOSIS — I10 Essential (primary) hypertension: Secondary | ICD-10-CM

## 2016-03-18 DIAGNOSIS — Z7689 Persons encountering health services in other specified circumstances: Secondary | ICD-10-CM | POA: Diagnosis not present

## 2016-03-18 DIAGNOSIS — F4310 Post-traumatic stress disorder, unspecified: Secondary | ICD-10-CM | POA: Insufficient documentation

## 2016-03-18 DIAGNOSIS — M25511 Pain in right shoulder: Secondary | ICD-10-CM | POA: Insufficient documentation

## 2016-03-18 DIAGNOSIS — F329 Major depressive disorder, single episode, unspecified: Secondary | ICD-10-CM

## 2016-03-18 DIAGNOSIS — F32A Depression, unspecified: Secondary | ICD-10-CM

## 2016-03-18 MED ORDER — PREDNISONE 10 MG PO TABS: ORAL_TABLET | ORAL | 0 refills | Status: DC

## 2016-03-18 NOTE — Patient Instructions (Signed)
It was a pleasure meeting you today! Please take medication as directed with food and follow up for a physical and lab work at your convenience.  You have been referred to the sports medicine physician for evaluation and treatment of your shoulder pain.   You will be contacted about your referral.  Please let us know if you have not heard back within 1 week about your referral.   Please continue to monitor your salt and blood pressure as we discussed. Bring your blood pressure readings with you to your next visit. Also, please report blood pressure average >150/90.  Shoulder Pain Many things can cause shoulder pain, including:  An injury to the area.  Overuse of the shoulder.  Arthritis. The source of the pain can be:  Inflammation.  An injury to the shoulder joint.  An injury to a tendon, ligament, or bone. Follow these instructions at home: Take these actions to help with your pain:  Squeeze a soft ball or a foam pad as much as possible. This helps to keep the shoulder from swelling. It also helps to strengthen the arm.  Take over-the-counter and prescription medicines only as told by your health care provider.  If directed, apply ice to the area:  Put ice in a plastic bag.  Place a towel between your skin and the bag.  Leave the ice on for 20 minutes, 2-3 times per day. Stop applying ice if it does not help with the pain.  If you were given a shoulder sling or immobilizer:  Wear it as told.  Remove it to shower or bathe.  Move your arm as little as possible, but keep your hand moving to prevent swelling. Contact a health care provider if:  Your pain gets worse.  Your pain is not relieved with medicines.  New pain develops in your arm, hand, or fingers. Get help right away if:  Your arm, hand, or fingers:  Tingle.  Become numb.  Become swollen.  Become painful.  Turn white or blue. This information is not intended to replace advice given to you by  your health care provider. Make sure you discuss any questions you have with your health care provider. Document Released: 10/14/2004 Document Revised: 08/31/2015 Document Reviewed: 04/29/2014 Elsevier Interactive Patient Education  2017 [employer]
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
## 2016-03-28 NOTE — Progress Notes (Deleted)
[name] [address] [place], [place] [address] Phone: [phone] Subjective:    I'm seeing this patient by the request  of:  Inez Catalina, FNP   CC: right shoulder pain  [id number]  Gina Baxter is a 31 y.o. female coming in with complaint of right shoulder pain. Impingement having this on and off for approximately 1 year. States that unfortunately over the course last 6 weeks it's been getting worse. Patient states that the pain seems to stay localized to the shoulder and seems to be worse with certain movements. Patient is trying over-the-counter anti-inflammatories with very minimal benefit. Patient has discussed this with the physical therapist previously and has tried some exercises. Ice and heat didn't give some temporary relief. He does state the pain gets better with rest as well. 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     Past Medical History:  Diagnosis Date  . Anxiety   . Hypertension   . PTSD (post-traumatic stress disorder)    Past Surgical History:  Procedure Laterality Date  . APPENDECTOMY    . CHOLECYSTECTOMY    . TONSILLECTOMY     Social History   Social History  . Marital status: Married    Spouse name: N/A  . Number of children: N/A  . Years of education: N/A   Social History Main Topics  . Smoking status: Never Smoker  . Smokeless tobacco: Never Used  . Alcohol use 1.2 oz/week    2 Glasses of wine per week  . Drug use: No  . Sexual activity: Yes    Partners: Male    Birth control/ protection: Condom   Other Topics Concern  . Not on file   Social History Narrative   Works as a Engineer, civil (consulting) in a rehab unit   Allergies  Allergen Reactions  . Bee Venom Swelling    swelling  . Gabapentin Swelling  . Silicone Swelling   Family History  Problem Relation Age of Onset  . Hypertension Mother   . Hyperlipidemia Mother   . Hypertension Father   . Diabetes Father   . Diabetes  Maternal Grandmother     Past medical history, social, surgical and family history all reviewed in electronic medical record.  No pertanent information unless stated regarding to the chief complaint.   Review of Systems:Review of systems updated and as accurate as of 03/28/16  No headache, visual changes, nausea, vomiting, diarrhea, constipation, dizziness, abdominal pain, skin rash, fevers, chills, night sweats, weight loss, swollen lymph nodes, body aches, joint swelling, muscle aches, chest pain, shortness of breath, mood changes.   Objective  unknown if currently breastfeeding. Systems examined below as of 03/28/16   General: No apparent distress alert and oriented x3 mood and affect normal, dressed appropriately.  HEENT: Pupils equal, extraocular movements intact  Respiratory: Patient's speak in full sentences and does not appear short of breath  Cardiovascular: No lower extremity edema, non tender, no erythema  Skin: Warm dry intact with no signs of infection or rash on extremities or on axial skeleton.  Abdomen: Soft nontender  Neuro: Cranial nerves II through XII are intact, neurovascularly intact in all extremities with 2+ DTRs and 2+ pulses.  Lymph: No lymphadenopathy of posterior or anterior cervical chain or axillae bilaterally.  Gait normal with good balance and coordination.  MSK:  Non tender with full range of motion and good stability and symmetric strength and tone of  elbows, wrist, hip, knee  and ankles bilaterally.  Shoulder: Right Inspection reveals no abnormalities, atrophy or asymmetry. Palpation is normal with no tenderness over AC joint or bicipital groove. ROM is full in all planes passively. Rotator cuff strength normal throughout. signs of impingement with positive Neer and Hawkin's tests, but negative empty can sign. Speeds and Yergason's tests normal. No labral pathology noted with negative Obrien's, negative clunk and good stability. Normal scapular  function observed. No painful arc and no drop arm sign. No apprehension sign  MSK US performed of: Right This study was ordered, performed, and interpreted by Terrilee FilesZach Kimaya Whitlatch D.O.  Shoulder:   Supraspinatus:  Appears normal on long and transverse views, Bursal bulge seen with shoulder abduction on impingement view. Infraspinatus:  Appears normal on long and transverse views. Significant increase in Doppler flow Subscapularis:  Appears normal on long and transverse views. Positive bursa Teres Minor:  Appears normal on long and transverse views. AC joint:  Capsule undistended, no geyser sign. Glenohumeral Joint:  Appears normal without effusion. Glenoid Labrum:  Intact without visualized tears. Biceps Tendon:  Appears normal on long and transverse views, no fraying of tendon, tendon located in intertubercular groove, no subluxation with shoulder internal or external rotation.  Impression: Subacromial bursitis  Procedure: Real-time Ultrasound Guided Injection of right glenohumeral joint Device: GE Logiq E  Ultrasound guided injection is preferred based studies that show increased duration, increased effect, greater accuracy, decreased procedural pain, increased response rate with ultrasound guided versus blind injection.  Verbal informed consent obtained.  Time-out conducted.  Noted no overlying erythema, induration, or other signs of local infection.  Skin prepped in a sterile fashion.  Local anesthesia: Topical Ethyl chloride.  With sterile technique and under real time ultrasound guidance:  Joint visualized.  23g 1  inch needle inserted posterior approach. Pictures taken for needle placement. Patient did have injection of 2 cc of 1% lidocaine, 2 cc of 0.5% Marcaine, and 1.0 cc of Kenalog 40 mg/dL. Completed without difficulty  Pain immediately resolved suggesting accurate placement of the medication.  Advised to call if fevers/chills, erythema, induration, drainage, or persistent bleeding.    Images permanently stored and available for review in the ultrasound unit.  Impression: Technically successful ultrasound guided injection.    Impression and Recommendations:     This case required medical decision making of moderate complexity.      Note: This dictation was prepared with Dragon dictation along with smaller phrase technology. Any transcriptional errors that result from this process are unintentional.

## 2016-03-29 ENCOUNTER — Ambulatory Visit: Payer: BLUE CROSS/BLUE SHIELD | Admitting: Family Medicine

## 2016-04-01 ENCOUNTER — Ambulatory Visit: Payer: BLUE CROSS/BLUE SHIELD | Admitting: Family Medicine

## 2016-04-07 ENCOUNTER — Other Ambulatory Visit: Payer: Self-pay

## 2016-04-07 ENCOUNTER — Ambulatory Visit: Payer: BLUE CROSS/BLUE SHIELD | Admitting: Family Medicine

## 2016-04-07 DIAGNOSIS — F4312 Post-traumatic stress disorder, chronic: Secondary | ICD-10-CM | POA: Diagnosis not present

## 2016-04-07 DIAGNOSIS — F3342 Major depressive disorder, recurrent, in full remission: Secondary | ICD-10-CM | POA: Diagnosis not present

## 2016-04-07 DIAGNOSIS — F4001 Agoraphobia with panic disorder: Secondary | ICD-10-CM | POA: Diagnosis not present

## 2016-04-07 DIAGNOSIS — F411 Generalized anxiety disorder: Secondary | ICD-10-CM | POA: Diagnosis not present

## 2016-04-07 DIAGNOSIS — Z0000 Encounter for general adult medical examination without abnormal findings: Secondary | ICD-10-CM

## 2016-04-08 ENCOUNTER — Other Ambulatory Visit: Payer: BLUE CROSS/BLUE SHIELD

## 2016-04-15 ENCOUNTER — Encounter: Payer: Self-pay | Admitting: Family Medicine

## 2016-04-15 ENCOUNTER — Ambulatory Visit (INDEPENDENT_AMBULATORY_CARE_PROVIDER_SITE_OTHER): Payer: BLUE CROSS/BLUE SHIELD | Admitting: Family Medicine

## 2016-04-15 VITALS — BP 120/78 | HR 82 | Temp 98.4°F | Wt 173.0 lb

## 2016-04-15 DIAGNOSIS — K219 Gastro-esophageal reflux disease without esophagitis: Secondary | ICD-10-CM

## 2016-04-15 DIAGNOSIS — F329 Major depressive disorder, single episode, unspecified: Secondary | ICD-10-CM

## 2016-04-15 DIAGNOSIS — I10 Essential (primary) hypertension: Secondary | ICD-10-CM | POA: Diagnosis not present

## 2016-04-15 DIAGNOSIS — M25511 Pain in right shoulder: Secondary | ICD-10-CM

## 2016-04-15 DIAGNOSIS — F32A Depression, unspecified: Secondary | ICD-10-CM

## 2016-04-15 DIAGNOSIS — Z0000 Encounter for general adult medical examination without abnormal findings: Secondary | ICD-10-CM | POA: Diagnosis not present

## 2016-04-15 LAB — CBC WITH DIFFERENTIAL/PLATELET
Basophils Absolute: 54 cells/uL (ref 0–200)
Basophils Relative: 1 %
EOS ABS: 216 {cells}/uL (ref 15–500)
Eosinophils Relative: 4 %
HEMATOCRIT: 35.5 % (ref 35.0–45.0)
HEMOGLOBIN: 11.8 g/dL (ref 11.7–15.5)
LYMPHS ABS: 2106 {cells}/uL (ref 850–3900)
Lymphocytes Relative: 39 %
MCH: 32 pg (ref 27.0–33.0)
MCHC: 33.2 g/dL (ref 32.0–36.0)
MCV: 96.2 fL (ref 80.0–100.0)
MONO ABS: 324 {cells}/uL (ref 200–950)
MPV: 9.1 fL (ref 7.5–12.5)
Monocytes Relative: 6 %
NEUTROS PCT: 50 %
Neutro Abs: 2700 cells/uL (ref 1500–7800)
Platelets: 347 10*3/uL (ref 140–400)
RBC: 3.69 MIL/uL — ABNORMAL LOW (ref 3.80–5.10)
RDW: 13.2 % (ref 11.0–15.0)
WBC: 5.4 10*3/uL (ref 3.8–10.8)

## 2016-04-15 LAB — BASIC METABOLIC PANEL
BUN: 19 mg/dL (ref 7–25)
CHLORIDE: 104 mmol/L (ref 98–110)
CO2: 23 mmol/L (ref 20–31)
Calcium: 9.8 mg/dL (ref 8.6–10.2)
Creat: 0.78 mg/dL (ref 0.50–1.10)
GLUCOSE: 97 mg/dL (ref 65–99)
POTASSIUM: 4 mmol/L (ref 3.5–5.3)
SODIUM: 139 mmol/L (ref 135–146)

## 2016-04-15 LAB — HEPATIC FUNCTION PANEL
ALT: 17 U/L (ref 6–29)
AST: 18 U/L (ref 10–30)
Albumin: 4.5 g/dL (ref 3.6–5.1)
Alkaline Phosphatase: 80 U/L (ref 33–115)
BILIRUBIN INDIRECT: 0.4 mg/dL (ref 0.2–1.2)
Bilirubin, Direct: 0.1 mg/dL (ref <=0.2)
Total Bilirubin: 0.5 mg/dL (ref 0.2–1.2)
Total Protein: 7.3 g/dL (ref 6.1–8.1)

## 2016-04-15 LAB — LIPID PANEL
Cholesterol: 161 mg/dL (ref <200)
HDL: 26 mg/dL — ABNORMAL LOW (ref >50)
LDL CALC: 92 mg/dL (ref <100)
Total CHOL/HDL Ratio: 6.2 Ratio — ABNORMAL HIGH (ref <5.0)
Triglycerides: 217 mg/dL — ABNORMAL HIGH (ref <150)
VLDL: 43 mg/dL — AB (ref <30)

## 2016-04-15 LAB — TSH: TSH: 0.6 mIU/L

## 2016-04-15 MED ORDER — OMEPRAZOLE 20 MG PO CPDR: 20.0000 mg | DELAYED_RELEASE_CAPSULE | Freq: Every day | ORAL | 3 refills | Status: AC

## 2016-04-15 NOTE — Progress Notes (Signed)
Subjective:    Patient ID: Gina Baxter, female    DOB: 1985/07/19, 31 y.o.   MRN: [id number]  HPI  Ms. Rozier is 31 year old nonsmoking female who presents today for a routine physical.  She has a PMH of HTN, depression, post-traumatic stress, and shoulder pain.  Depression/anxiety/post-traumatic stress;  She is followed by [facility] in [place] with great benefit.  She denies depressed or anxious mood, suicidal/homicidal ideation. Current medications are fluoxetine, buspirone, clonazepam as needed, and she uses trazodone at night if needed.  Shoulder pain was evaluated and treated on 03/18/16. This pain has been intermittently occurring over the past year.  She denied radiation but notes that pain is exacerbated with movement. Treatment with prednisone and referral to sports medicine was completed. Sports medicine appointment is 04/27/16.  Today, she reports that she aggravates her shoulder pain when she picks up her 32 year old and lifts patients at work. Alleviating factor is noted with rest and ice. She is receiving PT through a colleague at work. 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 She monitors her BP and reports systolic averages of 120s-130s and diastolic averages of 70s to 80s. She denies chest pain, palpitations, edema, SOB, numbness, tingling, weakness, or headaches.   She is UTD with immunizations and her Pap is due in April 2018 She is UTD with dental and vision.  She was evaluated for gastroesophageal reflux 02/07/16 in the ED due to concerns for chest pain however pain subsided, EKG NSR, and all other tests were normal. She reports one additional episode of reflux and would like to start a medication. Trigger may be NSAIDs that she taking due to her shoulder pain. No symptoms of reflux today.  Review of Systems  Constitutional: Negative for chills,  diaphoresis, fatigue and fever.  HENT: Negative for congestion, postnasal drip, rhinorrhea, sinus pain, sinus pressure, sneezing and sore throat.   Respiratory: Negative for cough, shortness of breath and wheezing.   Cardiovascular: Negative for chest pain, palpitations and leg swelling.  Gastrointestinal: Negative for abdominal pain, diarrhea, nausea and vomiting.  Genitourinary: Negative for dysuria and hematuria.  Musculoskeletal: Negative for myalgias.  Neurological: Negative for dizziness, weakness, light-headedness, numbness and headaches.  Psychiatric/Behavioral:       Denies depressed or anxious mood   Past Medical History:  Diagnosis Date  . Anxiety   . Hypertension   . PTSD (post-traumatic stress disorder)      Social History   Social History  . Marital status: Married    Spouse name: N/A  . Number of children: N/A  . Years of education: N/A   Occupational History  . Not on file.   Social History Main Topics  . Smoking status: Never Smoker  . Smokeless tobacco: Never Used  . Alcohol use 1.2 oz/week    2 Glasses of wine per week  . Drug use: No  . Sexual activity: Yes    Partners: Male    Birth control/ protection: Condom   Other Topics Concern  . Not on file   Social History Narrative   Works as a Engineer, civil (consulting) in a rehab unit    Past Surgical History:  Procedure Laterality Date  . APPENDECTOMY    . CHOLECYSTECTOMY    . TONSILLECTOMY      Family History  Problem Relation Age of Onset  . Hypertension Mother   . Hyperlipidemia  Mother   . Hypertension Father   . Diabetes Father   . Diabetes Maternal Grandmother     Allergies  Allergen Reactions  . Bee Venom Swelling    swelling  . Gabapentin Swelling  . Silicone Swelling    Current Outpatient Prescriptions on File Prior to Visit  Medication Sig Dispense Refill  . amphetamine-dextroamphetamine (ADDERALL) 20 MG tablet Take 20 mg by mouth 2 (two) times daily.     . busPIRone (BUSPAR) 30 MG tablet  Take 30 mg by mouth 2 (two) times daily.    . clonazePAM (KLONOPIN) 0.5 MG tablet Take 0.5 mg by mouth daily as needed for anxiety.    Marland Kitchen. FLUoxetine (PROZAC) 40 MG capsule Take 40 mg by mouth daily. Take 2 tabs po daily    . HYDROcodone-acetaminophen (NORCO/VICODIN) 5-325 MG per tablet Take 1-2 tablets by mouth every 6 (six) hours as needed for pain. (Patient not taking: Reported on 03/18/2016) 15 tablet 0  . Levonorgestrel-Ethinyl Estrad (LEVORA 0.15/30, 28, PO) Take by mouth.    . olmesartan (BENICAR) 20 MG tablet Take 20 mg by mouth daily.    . traZODone (DESYREL) 50 MG tablet Take 50 mg by mouth at bedtime as needed for sleep.     No current facility-administered medications on file prior to visit.     BP 120/78 (BP Location: Left Arm, Patient Position: Sitting, Cuff Size: Normal)   Pulse 82   Temp 98.4 F (36.9 C) (Oral)   Wt 173 lb (78.5 kg)   LMP 04/14/2016 (Exact Date)   SpO2 98%   Breastfeeding? No   BMI 27.10 kg/m       Objective:   Physical Exam Physical Exam  Constitutional: She is oriented to person, place, and time. She appears well-developed and well-nourished. No distress.  HENT:  Head: Normocephalic and atraumatic.  Right Ear: Tympanic membrane and ear canal normal.  Left Ear: Tympanic membrane and ear canal normal.  Mouth/Throat: Oropharynx is clear and moist.  Eyes: Pupils are equal, round, and reactive to light. No scleral icterus.  Neck: Normal range of motion. No thyromegaly present.  Cardiovascular: Normal rate and regular rhythm.   No murmur heard. Pulmonary/Chest: Effort normal and breath sounds normal. No respiratory distress. He has no wheezes. She has no rales. She exhibits no tenderness.  Abdominal: Soft. Bowel sounds are normal. She exhibits no distension and no mass. There is no tenderness. There is no rebound and no guarding.  Musculoskeletal: She exhibits no edema.  Lymphadenopathy:    She has no cervical adenopathy.  Neurological: She is alert  and oriented to person, place, and time. She has normal patellar reflexes. She exhibits normal muscle tone. Coordination normal.  Skin: Skin is warm and dry.  Psychiatric: She has a normal mood and affect. Her behavior is normal. Judgment and thought content normal.  Breasts: Examined lying Right: Without masses, retractions, discharge or axillary adenopathy.  Left: Without masses, retractions, discharge or axillary adenopathy.  Pelvic exam deferred today; she is UTD through gynecology and she will obtain records this office.       Assessment & Plan:  1. Routine general medical examination at a health care facility 31 y.o. female presenting for annual physical.  Health Maintenance counseling: 1. Anticipatory guidance: Patient counseled regarding regular dental exams, eye exams, wearing seatbelts.  2. Risk factor reduction:  Advised patient of need for regular exercise and diet rich and fruits and vegetables to reduce risk of heart attack and stroke.  3. Immunizations/screenings/ancillary  studies Immunization History  Administered Date(s) Administered  . Influenza-Unspecified 09/15/2015  . Tdap 11/01/2015   Health Maintenance Due  Topic Date Due  . HIV Screening  08/08/1999   4. Cervical cancer screening- UTD; gynecology 5. Breast cancer screening-  breast exam today:  No suspicious findings; exam WNL 6. Colon cancer screening - Not needed 7. Skin cancer screening- No suspicious lesions noted on abdomen/back/arms/legs. Reviewed use of sunscreen  2. Hypertension, essential BP WNL without medication today; Advised her to continue monitoring BP and report readings >140/90. Advised low salt diet  3. Right shoulder pain, unspecified chronicity Stable today; appointment for sports medicine evaluation and PT will be continued.  4. Depression, unspecified depression type Controlled; Followed by [facility]  5. Gastroesophageal reflux disease, esophagitis presence not  specified Controlled today; trial of omeprazole will be initiated for 2 months; advised avoidance of triggers such as caffeine, alcohol, chocolate, peppermint, NSAIDS, spicy food, and fatty foods.  Advised to report if symptoms reoccur and are not improved with treatment. If symptoms continue, will consider referral to GI for further evaluation.   Lab work will be obtained today. Follow up in  Roddie Mc, FNP-C

## 2016-04-15 NOTE — Patient Instructions (Signed)
It was a pleasure to see you today!  We have ordered labs or studies at this visit. It can take up to 1-2 weeks for results and processing. IF results require follow up or explanation, we will call you with instructions. Clinically stable results will be released to your [facility]. If you have not heard from [place] or cannot find your results in [facility] in 2 weeks please contact our office at [phone].  If you are not yet signed up for [facility], please consider signing up   Follow in one to two months for evaluation of reflux or sooner if needed.   Health Maintenance, Female Adopting a healthy lifestyle and getting preventive care can go a long way to promote health and wellness. Talk with your health care provider about what schedule of regular examinations is right for you. This is a good chance for you to check in with your provider about disease prevention and staying healthy. In between checkups, there are plenty of things you can do on your own. Experts have done a lot of research about which lifestyle changes and preventive measures are most likely to keep you healthy. Ask your health care provider for more information. Weight and diet Eat a healthy diet  Be sure to include plenty of vegetables, fruits, low-fat dairy products, and lean protein.  Do not eat a lot of foods high in solid fats, added sugars, or salt.  Get regular exercise. This is one of the most important things you can do for your health.  Most adults should exercise for at least 150 minutes each week. The exercise should increase your heart rate and make you sweat (moderate-intensity exercise).  Most adults should also do strengthening exercises at least twice a week. This is in addition to the moderate-intensity exercise. Maintain a healthy weight  Body mass index (BMI) is a measurement that can be used to identify possible weight problems. It estimates body fat based on height and weight. Your health care provider can  help determine your BMI and help you achieve or maintain a healthy weight.  For females 65 years of age and older:  A BMI below 18.5 is considered underweight.  A BMI of 18.5 to 24.9 is normal.  A BMI of 25 to 29.9 is considered overweight.  A BMI of 30 and above is considered obese. Watch levels of cholesterol and blood lipids  You should start having your blood tested for lipids and cholesterol at 32 years of age, then have this test every 5 years.  You may need to have your cholesterol levels checked more often if:  Your lipid or cholesterol levels are high.  You are older than 32 years of age.  You are at high risk for heart disease. Cancer screening Lung Cancer  Lung cancer screening is recommended for adults 40-50 years old who are at high risk for lung cancer because of a history of smoking.  A yearly low-dose CT scan of the lungs is recommended for people who:  Currently smoke.  Have quit within the past 15 years.  Have at least a 30-pack-year history of smoking. A pack year is smoking an average of one pack of cigarettes a day for 1 year.  Yearly screening should continue until it has been 15 years since you quit.  Yearly screening should stop if you develop a health problem that would prevent you from having lung cancer treatment. Breast Cancer  Practice breast self-awareness. This means understanding how your breasts normally appear  and feel.  It also means doing regular breast self-exams. Let your health care provider know about any changes, no matter how small.  If you are in your 20s or 30s, you should have a clinical breast exam (CBE) by a health care provider every 1-3 years as part of a regular health exam.  If you are 44 or older, have a CBE every year. Also consider having a breast X-ray (mammogram) every year.  If you have a family history of breast cancer, talk to your health care provider about genetic screening.  If you are at high risk for  breast cancer, talk to your health care provider about having an MRI and a mammogram every year.  Breast cancer gene (BRCA) assessment is recommended for women who have family members with BRCA-related cancers. BRCA-related cancers include:  Breast.  Ovarian.  Tubal.  Peritoneal cancers.  Results of the assessment will determine the need for genetic counseling and BRCA1 and BRCA2 testing. Cervical Cancer  Your health care provider may recommend that you be screened regularly for cancer of the pelvic organs (ovaries, uterus, and vagina). This screening involves a pelvic examination, including checking for microscopic changes to the surface of your cervix (Pap test). You may be encouraged to have this screening done every 3 years, beginning at age 33.  For women ages 24-65, health care providers may recommend pelvic exams and Pap testing every 3 years, or they may recommend the Pap and pelvic exam, combined with testing for human papilloma virus (HPV), every 5 years. Some types of HPV increase your risk of cervical cancer. Testing for HPV may also be done on women of any age with unclear Pap test results.  Other health care providers may not recommend any screening for nonpregnant women who are considered low risk for pelvic cancer and who do not have symptoms. Ask your health care provider if a screening pelvic exam is right for you.  If you have had past treatment for cervical cancer or a condition that could lead to cancer, you need Pap tests and screening for cancer for at least 20 years after your treatment. If Pap tests have been discontinued, your risk factors (such as having a new sexual partner) need to be reassessed to determine if screening should resume. Some women have medical problems that increase the chance of getting cervical cancer. In these cases, your health care provider may recommend more frequent screening and Pap tests. Colorectal Cancer  This type of cancer can be  detected and often prevented.  Routine colorectal cancer screening usually begins at 32 years of age and continues through 32 years of age.  Your health care provider may recommend screening at an earlier age if you have risk factors for colon cancer.  Your health care provider may also recommend using home test kits to check for hidden blood in the stool.  A small camera at the end of a tube can be used to examine your colon directly (sigmoidoscopy or colonoscopy). This is done to check for the earliest forms of colorectal cancer.  Routine screening usually begins at age 85.  Direct examination of the colon should be repeated every 5-10 years through 32 years of age. However, you may need to be screened more often if early forms of precancerous polyps or small growths are found. Skin Cancer  Check your skin from head to toe regularly.  Tell your health care provider about any new moles or changes in moles, especially if there is  a change in a mole's shape or color.  Also tell your health care provider if you have a mole that is larger than the size of a pencil eraser.  Always use sunscreen. Apply sunscreen liberally and repeatedly throughout the day.  Protect yourself by wearing long sleeves, pants, a wide-brimmed hat, and sunglasses whenever you are outside. Heart disease, diabetes, and high blood pressure  High blood pressure causes heart disease and increases the risk of stroke. High blood pressure is more likely to develop in:  People who have blood pressure in the high end of the normal range (130-139/85-89 mm Hg).  People who are overweight or obese.  People who are African American.  If you are 2-72 years of age, have your blood pressure checked every 3-5 years. If you are 95 years of age or older, have your blood pressure checked every year. You should have your blood pressure measured twice-once when you are at a hospital or clinic, and once when you are not at a hospital  or clinic. Record the average of the two measurements. To check your blood pressure when you are not at a hospital or clinic, you can use:  An automated blood pressure machine at a pharmacy.  A home blood pressure monitor.  If you are between 22 years and 46 years old, ask your health care provider if you should take aspirin to prevent strokes.  Have regular diabetes screenings. This involves taking a blood sample to check your fasting blood sugar level.  If you are at a normal weight and have a low risk for diabetes, have this test once every three years after 32 years of age.  If you are overweight and have a high risk for diabetes, consider being tested at a younger age or more often. Preventing infection Hepatitis B  If you have a higher risk for hepatitis B, you should be screened for this virus. You are considered at high risk for hepatitis B if:  You were born in a country where hepatitis B is common. Ask your health care provider which countries are considered high risk.  Your parents were born in a high-risk country, and you have not been immunized against hepatitis B (hepatitis B vaccine).  You have HIV or AIDS.  You use needles to inject street drugs.  You live with someone who has hepatitis B.  You have had sex with someone who has hepatitis B.  You get hemodialysis treatment.  You take certain medicines for conditions, including cancer, organ transplantation, and autoimmune conditions. Hepatitis C  Blood testing is recommended for:  Everyone born from [birth year] through 1966.  Anyone with known risk factors for hepatitis C. Sexually transmitted infections (STIs)  You should be screened for sexually transmitted infections (STIs) including gonorrhea and chlamydia if:  You are sexually active and are younger than 32 years of age.  You are older than 32 years of age and your health care provider tells you that you are at risk for this type of infection.  Your sexual  activity has changed since you were last screened and you are at an increased risk for chlamydia or gonorrhea. Ask your health care provider if you are at risk.  If you do not have HIV, but are at risk, it may be recommended that you take a prescription medicine daily to prevent HIV infection. This is called pre-exposure prophylaxis (PrEP). You are considered at risk if:  You are sexually active and do not regularly use condoms  or know the HIV status of your partner(s).  You take drugs by injection.  You are sexually active with a partner who has HIV. Talk with your health care provider about whether you are at high risk of being infected with HIV. If you choose to begin PrEP, you should first be tested for HIV. You should then be tested every 3 months for as long as you are taking PrEP. Pregnancy  If you are premenopausal and you may become pregnant, ask your health care provider about preconception counseling.  If you may become pregnant, take 400 to 800 micrograms (mcg) of folic acid every day.  If you want to prevent pregnancy, talk to your health care provider about birth control (contraception). Osteoporosis and menopause  Osteoporosis is a disease in which the bones lose minerals and strength with aging. This can result in serious bone fractures. Your risk for osteoporosis can be identified using a bone density scan.  If you are 85 years of age or older, or if you are at risk for osteoporosis and fractures, ask your health care provider if you should be screened.  Ask your health care provider whether you should take a calcium or vitamin D supplement to lower your risk for osteoporosis.  Menopause may have certain physical symptoms and risks.  Hormone replacement therapy may reduce some of these symptoms and risks. Talk to your health care provider about whether hormone replacement therapy is right for you. Follow these instructions at home:  Schedule regular health, dental, and  eye exams.  Stay current with your immunizations.  Do not use any tobacco products including cigarettes, chewing tobacco, or electronic cigarettes.  If you are pregnant, do not drink alcohol.  If you are breastfeeding, limit how much and how often you drink alcohol.  Limit alcohol intake to no more than 1 drink per day for nonpregnant women. One drink equals 12 ounces of beer, 5 ounces of wine, or 1 ounces of hard liquor.  Do not use street drugs.  Do not share needles.  Ask your health care provider for help if you need support or information about quitting drugs.  Tell your health care provider if you often feel depressed.  Tell your health care provider if you have ever been abused or do not feel safe at home. This information is not intended to replace advice given to you by your health care provider. Make sure you discuss any questions you have with your health care provider. Document Released: 07/20/2010 Document Revised: 06/12/2015 Document Reviewed: 10/08/2014 Elsevier Interactive Patient Education  2017 [place] for Gastroesophageal Reflux Disease, Adult When you have gastroesophageal reflux disease (GERD), the foods you eat and your eating habits are very important. Choosing the right foods can help ease your discomfort. What guidelines do I need to follow?  Choose fruits, vegetables, whole grains, and low-fat dairy products.  Choose low-fat meat, fish, and poultry.  Limit fats such as oils, salad dressings, butter, nuts, and avocado.  Keep a food diary. This helps you identify foods that cause symptoms.  Avoid foods that cause symptoms. These may be different for everyone.  Eat small meals often instead of 3 large meals a day.  Eat your meals slowly, in a place where you are relaxed.  Limit fried foods.  Cook foods using methods other than frying.  Avoid drinking alcohol.  Avoid drinking large amounts of liquids with your  meals.  Avoid bending over or lying down until 2-3 hours  after eating. What foods are not recommended? These are some foods and drinks that may make your symptoms worse: Vegetables  Tomatoes. Tomato juice. Tomato and spaghetti sauce. Chili peppers. Onion and garlic. Horseradish. Fruits  Oranges, grapefruit, and lemon (fruit and juice). Meats  High-fat meats, fish, and poultry. This includes hot dogs, ribs, ham, sausage, salami, and bacon. Dairy  Whole milk and chocolate milk. Sour cream. Cream. Butter. Ice cream. Cream cheese. Drinks  Coffee and tea. Bubbly (carbonated) drinks or energy drinks. Condiments  Hot sauce. Barbecue sauce. Sweets/Desserts  Chocolate and cocoa. Donuts. Peppermint and spearmint. Fats and Oils  High-fat foods. This includes [place] fries and potato chips. Other  Vinegar. Strong spices. This includes black pepper, white pepper, red pepper, cayenne, curry powder, cloves, ginger, and chili powder. The items listed above may not be a complete list of foods and drinks to avoid. Contact your dietitian for more information.  This information is not intended to replace advice given to you by your health care provider. Make sure you discuss any questions you have with your health care provider. Document Released: 07/06/2011 Document Revised: 06/12/2015 Document Reviewed: 11/08/2012 Elsevier Interactive Patient Education  2017 [employer].

## 2016-04-26 NOTE — Progress Notes (Signed)
[name] [address] [place], [place] [address] Phone: [phone] Subjective:    I'm seeing this patient by the request  of:  Inez Catalina, FNP   CC: Right shoulder pain  [id number]  Gina Baxter is a 31 y.o. female coming in with complaint of right shoulder pain. Patient has had this pain intimately for one year. Patient has been given prednisone with very minimal improvement. States that when she picks up her 40-year-old lifts anything at work she has increasing discomfort. Patient states rest and ice is helpful. Patient has been seen a physical therapist at work with some mild to moderate improvement. Patient does work as a Engineer, civil (consulting).     Past Medical History:  Diagnosis Date  . Anxiety   . Hypertension   . PTSD (post-traumatic stress disorder)    Past Surgical History:  Procedure Laterality Date  . APPENDECTOMY    . CHOLECYSTECTOMY    . TONSILLECTOMY     Social History   Social History  . Marital status: Married    Spouse name: N/A  . Number of children: N/A  . Years of education: N/A   Social History Main Topics  . Smoking status: Never Smoker  . Smokeless tobacco: Never Used  . Alcohol use 1.2 oz/week    2 Glasses of wine per week  . Drug use: No  . Sexual activity: Yes    Partners: Male    Birth control/ protection: Condom   Other Topics Concern  . Not on file   Social History Narrative   Works as a Engineer, civil (consulting) in a rehab unit   Allergies  Allergen Reactions  . Bee Venom Swelling    swelling  . Gabapentin Swelling  . Silicone Swelling   Family History  Problem Relation Age of Onset  . Hypertension Mother   . Hyperlipidemia Mother   . Hypertension Father   . Diabetes Father   . Diabetes Maternal Grandmother     Past medical history, social, surgical and family history all reviewed in electronic medical record.  No pertanent information unless stated regarding to the chief complaint.   Review of  Systems:Review of systems updated and as accurate as of 04/26/16  No headache, visual changes, nausea, vomiting, diarrhea, constipation, dizziness, abdominal pain, skin rash, fevers, chills, night sweats, weight loss, swollen lymph nodes, body aches, joint swelling, muscle aches, chest pain, shortness of breath, mood changes.   Objective  Last menstrual period 04/14/2016. Systems examined below as of 04/26/16   General: No apparent distress alert and oriented x3 mood and affect normal, dressed appropriately.  HEENT: Pupils equal, extraocular movements intact  Respiratory: Patient's speak in full sentences and does not appear short of breath  Cardiovascular: No lower extremity edema, non tender, no erythema  Skin: Warm dry intact with no signs of infection or rash on extremities or on axial skeleton.  Abdomen: Soft nontender  Neuro: Cranial nerves II through XII are intact, neurovascularly intact in all extremities with 2+ DTRs and 2+ pulses.  Lymph: No lymphadenopathy of posterior or anterior cervical chain or axillae bilaterally.  Gait normal with good balance and coordination.  MSK:  Non tender with full range of motion and good stability and symmetric strength and tone of shoulders, elbows, wrist, hip, knee and ankles bilaterally.  Shoulder: Right Inspection reveals no abnormalities, atrophy or asymmetry.Poor posture noted  Palpation is normal with no tenderness over AC joint or bicipital groove. ROM is full in all planes  passively. Rotator cuff strength normal throughout. signs of impingement with positive Neer and Hawkin's tests, but negative empty can sign. Speeds and Yergason's tests normal. No labral pathology noted with negative Obrien's, negative clunk and good stability. Normal scapular function observed. No painful arc and no drop arm sign. No apprehension sign  MSK US performed of: Right This study was ordered, performed, and interpreted by Terrilee Files D.O.  Shoulder:     Supraspinatus:  Appears normal on long and transverse views, Bursal bulge seen with shoulder abduction on impingement view. Infraspinatus:  Appears normal on long and transverse views. Significant increase in Doppler flow Subscapularis:  Appears normal on long and transverse views. Positive bursa Teres Minor:  Appears normal on long and transverse views. AC joint:  Capsule undistended, no geyser sign. Glenohumeral Joint:  Appears normal without effusion. Glenoid Labrum:  Intact without visualized tears. Biceps Tendon:  Appears normal on long and transverse views, no fraying of tendon, tendon located in intertubercular groove, no subluxation with shoulder internal or external rotation.  Impression: Subacromial bursitis  Procedure note  /97110; 15 minutes spent for Therapeutic exercises as stated in above notes.  This included exercises focusing on stretching, strengthening, with significant focus on eccentric aspects.  Shoulder Exercises that included:  Basic scapular stabilization to include adduction and depression of scapula Scaption, focusing on proper movement and good control Internal and External rotation utilizing a theraband, with elbow tucked at side entire time Rows with theraband   Proper technique shown and discussed handout in great detail with ATC.  All questions were discussed and answered.   Impression and Recommendations:     This case required medical decision making of moderate complexity.      Note: This dictation was prepared with Dragon dictation along with smaller phrase technology. Any transcriptional errors that result from this process are unintentional.

## 2016-04-27 ENCOUNTER — Ambulatory Visit: Payer: Self-pay

## 2016-04-27 ENCOUNTER — Ambulatory Visit (INDEPENDENT_AMBULATORY_CARE_PROVIDER_SITE_OTHER): Payer: BLUE CROSS/BLUE SHIELD | Admitting: Family Medicine

## 2016-04-27 ENCOUNTER — Encounter: Payer: Self-pay | Admitting: Family Medicine

## 2016-04-27 VITALS — BP 142/96 | HR 84 | Resp 16 | Wt 175.0 lb

## 2016-04-27 DIAGNOSIS — M7551 Bursitis of right shoulder: Secondary | ICD-10-CM

## 2016-04-27 DIAGNOSIS — M25511 Pain in right shoulder: Secondary | ICD-10-CM

## 2016-04-27 MED ORDER — VITAMIN D (ERGOCALCIFEROL) 1.25 MG (50000 UNIT) PO CAPS: 50000.0000 [IU] | ORAL_CAPSULE | ORAL | 0 refills | Status: DC

## 2016-04-27 MED ORDER — DICLOFENAC SODIUM 2 % TD SOLN: 2.0000 "application " | Freq: Two times a day (BID) | TRANSDERMAL | 3 refills | Status: AC

## 2016-04-27 NOTE — Progress Notes (Signed)
Pre-visit discussion using our clinic review tool. No additional management support is needed unless otherwise documented below in the visit note.  

## 2016-04-27 NOTE — Assessment & Plan Note (Signed)
Patient does have more of the right shoulder bursitis. Discussed with patient at great length. We discussed icing regimen and home exercises. We discussed which activities to do a which was to avoid. Patient increase activity as tolerated. Follow-up again in 4 weeks. Worsening symptoms we'll consider injection.

## 2016-04-27 NOTE — Patient Instructions (Addendum)
good to see you  Gina Baxter is your friend.  Stay active.  On wall with heels, butt shoulder and head touching for a goal of 5 minutes daily  Keep monitor at eye level Exercises 3 times a week.  pennsaid pinkie amount topically 2 times daily as needed.  Once weekly vitamin D for 12 weeks.  Look for over the counter for turmeric  daily  See me again in 4 weeks.

## 2016-05-10 DIAGNOSIS — F900 Attention-deficit hyperactivity disorder, predominantly inattentive type: Secondary | ICD-10-CM | POA: Diagnosis not present

## 2016-05-10 DIAGNOSIS — F4001 Agoraphobia with panic disorder: Secondary | ICD-10-CM | POA: Diagnosis not present

## 2016-05-10 DIAGNOSIS — F411 Generalized anxiety disorder: Secondary | ICD-10-CM | POA: Diagnosis not present

## 2016-05-10 DIAGNOSIS — F3342 Major depressive disorder, recurrent, in full remission: Secondary | ICD-10-CM | POA: Diagnosis not present

## 2016-05-23 NOTE — Progress Notes (Deleted)
Gina Baxter [address] [place], [place] [address] Phone: [phone] Subjective:    I'm seeing this patient by the request  of:  Roddie Mc, FNP   CC: Right shoulder pain  [id number]  Gina Baxter is a 31 y.o. female coming in with complaint of right shoulder pain. Patient has had this pain intimately for one year. Patient has been given prednisone with very minimal improvement. States that when she picks up her 87-year-old lifts anything at work she has increasing discomfort. Patient states rest and ice is helpful. Patient has been seen a physical therapist at work with some mild to moderate improvement. Patient does work as a Engineer, civil (consulting).     Past Medical History:  Diagnosis Date  . Anxiety   . Hypertension   . PTSD (post-traumatic stress disorder)    Past Surgical History:  Procedure Laterality Date  . APPENDECTOMY    . CHOLECYSTECTOMY    . TONSILLECTOMY     Social History   Social History  . Marital status: Married    Spouse name: N/A  . Number of children: N/A  . Years of education: N/A   Social History Main Topics  . Smoking status: Never Smoker  . Smokeless tobacco: Never Used  . Alcohol use 1.2 oz/week    2 Glasses of wine per week  . Drug use: No  . Sexual activity: Yes    Partners: Male    Birth control/ protection: Condom   Other Topics Concern  . Not on file   Social History Narrative   Works as a Engineer, civil (consulting) in a rehab unit   Allergies  Allergen Reactions  . Bee Venom Swelling    swelling  . Gabapentin Swelling  . Silicone Swelling   Family History  Problem Relation Age of Onset  . Hypertension Mother   . Hyperlipidemia Mother   . Hypertension Father   . Diabetes Father   . Diabetes Maternal Grandmother     Past medical history, social, surgical and family history all reviewed in electronic medical record.  No pertanent information unless stated regarding to the chief complaint.   Review of  Systems:Review of systems updated and as accurate as of 05/23/16  No headache, visual changes, nausea, vomiting, diarrhea, constipation, dizziness, abdominal pain, skin rash, fevers, chills, night sweats, weight loss, swollen lymph nodes, body aches, joint swelling, muscle aches, chest pain, shortness of breath, mood changes.   Objective  There were no vitals taken for this visit. Systems examined below as of 05/23/16   General: No apparent distress alert and oriented x3 mood and affect normal, dressed appropriately.  HEENT: Pupils equal, extraocular movements intact  Respiratory: Patient's speak in full sentences and does not appear short of breath  Cardiovascular: No lower extremity edema, non tender, no erythema  Skin: Warm dry intact with no signs of infection or rash on extremities or on axial skeleton.  Abdomen: Soft nontender  Neuro: Cranial nerves II through XII are intact, neurovascularly intact in all extremities with 2+ DTRs and 2+ pulses.  Lymph: No lymphadenopathy of posterior or anterior cervical chain or axillae bilaterally.  Gait normal with good balance and coordination.  MSK:  Non tender with full range of motion and good stability and symmetric strength and tone of shoulders, elbows, wrist, hip, knee and ankles bilaterally.  Shoulder: Right Inspection reveals no abnormalities, atrophy or asymmetry.Poor posture noted  Palpation is normal with no tenderness over AC joint or bicipital groove. ROM is full  in all planes passively. Rotator cuff strength normal throughout. signs of impingement with positive Neer and Hawkin's tests, but negative empty can sign. Speeds and Yergason's tests normal. No labral pathology noted with negative Obrien's, negative clunk and good stability. Normal scapular function observed. No painful arc and no drop arm sign. No apprehension sign  MSK US performed of: Right This study was ordered, performed, and interpreted by Terrilee FilesZach Sitlaly Gudiel  D.O.  Shoulder:   Supraspinatus:  Appears normal on long and transverse views, Bursal bulge seen with shoulder abduction on impingement view. Infraspinatus:  Appears normal on long and transverse views. Significant increase in Doppler flow Subscapularis:  Appears normal on long and transverse views. Positive bursa Teres Minor:  Appears normal on long and transverse views. AC joint:  Capsule undistended, no geyser sign. Glenohumeral Joint:  Appears normal without effusion. Glenoid Labrum:  Intact without visualized tears. Biceps Tendon:  Appears normal on long and transverse views, no fraying of tendon, tendon located in intertubercular groove, no subluxation with shoulder internal or external rotation.  Impression: Subacromial bursitis  Procedure note  /97110; 15 minutes spent for Therapeutic exercises as stated in above notes.  This included exercises focusing on stretching, strengthening, with significant focus on eccentric aspects.  Shoulder Exercises that included:  Basic scapular stabilization to include adduction and depression of scapula Scaption, focusing on proper movement and good control Internal and External rotation utilizing a theraband, with elbow tucked at side entire time Rows with theraband   Proper technique shown and discussed handout in great detail with ATC.  All questions were discussed and answered.   Impression and Recommendations:     This case required medical decision making of moderate complexity.      Note: This dictation was prepared with Dragon dictation along with smaller phrase technology. Any transcriptional errors that result from this process are unintentional.

## 2016-05-24 ENCOUNTER — Ambulatory Visit: Payer: BLUE CROSS/BLUE SHIELD | Admitting: Family Medicine

## 2016-07-12 ENCOUNTER — Telehealth: Payer: Self-pay | Admitting: Family Medicine

## 2016-07-12 ENCOUNTER — Encounter: Payer: Self-pay | Admitting: Family Medicine

## 2016-07-12 ENCOUNTER — Ambulatory Visit (INDEPENDENT_AMBULATORY_CARE_PROVIDER_SITE_OTHER): Payer: BLUE CROSS/BLUE SHIELD | Admitting: Family Medicine

## 2016-07-12 VITALS — BP 120/90 | HR 95 | Temp 97.5°F | Wt 173.4 lb

## 2016-07-12 DIAGNOSIS — R002 Palpitations: Secondary | ICD-10-CM | POA: Diagnosis not present

## 2016-07-12 DIAGNOSIS — R000 Tachycardia, unspecified: Secondary | ICD-10-CM

## 2016-07-12 NOTE — Progress Notes (Signed)
Subjective:    Patient ID: Gina Baxter, female    DOB: October 21, 1985, 31 y.o.   MRN: [id number]  HPI  Ms. Heaton is a 31 year old female who presents today for evaluation of her blood pressure after checking her blood pressure daily for the past month and noting elevated readings and palpitations that occur intermittently.  She reports that BP fluctuates daily with elevated readings noted as high as 160s systolic and 95 diastolic readings. She does endorse that this result has been obtained using a wrist cuff but has noted elevated readings with am arm cuff she checks this at work. 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 She reports intermittent palpitations that resolve spontaneously and one episode of dizziness that resolved within seconds. She does not have these symptoms at this time. She denies chest pain, palpitations, jaw pain, arm pain, N/V, SOB, numbness, tingling, weakness, headaches, syncope,or edema. She denies taking adderall today but has been taking this on a regular basis. She struggles with anxiety and states that she will become anxious if she monitors her BP and it is elevated. History of anxiety, depression, and PTSD with treatment of adderal, buspirone, clonazepam, and trazodone. She is followed by psychiatry and has requested an appointment for evaluation of medication management.  Review of Systems  Constitutional: Negative for chills, fatigue and fever.  Eyes: Negative for visual disturbance.  Respiratory: Negative for cough, shortness of breath and wheezing.   Cardiovascular: Negative for chest pain and palpitations.  Gastrointestinal: Negative for abdominal pain, constipation, diarrhea, nausea and vomiting.  Skin: Negative for rash.  Neurological: Negative for dizziness, seizures, weakness, light-headedness, numbness and headaches.  Psychiatric/Behavioral:       Denies depressed or anxious mood currently;  reports prior episodes of anxiety   Past Medical History:  Diagnosis Date  . Anxiety   . Hypertension   . PTSD (post-traumatic stress disorder)      Social History   Social History  . Marital status: Married    Spouse name: N/A  . Number of children: N/A  . Years of education: N/A   Occupational History  . Not on file.   Social History Main Topics  . Smoking status: Never Smoker  . Smokeless tobacco: Never Used  . Alcohol use 1.2 oz/week    2 Glasses of wine per week  . Drug use: No  . Sexual activity: Yes    Partners: Male    Birth control/ protection: Condom   Other Topics Concern  . Not on file   Social History Narrative   Works as a Engineer, civil (consulting) in a rehab unit    Past Surgical History:  Procedure Laterality Date  . APPENDECTOMY    . CHOLECYSTECTOMY    . TONSILLECTOMY      Family History  Problem Relation Age of Onset  . Hypertension Mother   . Hyperlipidemia Mother   . Hypertension Father   . Diabetes Father   . Diabetes Maternal Grandmother     Allergies  Allergen Reactions  . Bee Venom Swelling    swelling  . Gabapentin Swelling  . Silicone Swelling    Current Outpatient Prescriptions on File Prior to Visit  Medication Sig Dispense Refill  . amphetamine-dextroamphetamine (ADDERALL) 20 MG tablet Take 20 mg by mouth 2 (two) times daily.     . busPIRone (BUSPAR) 30 MG tablet Take 30 mg by mouth 2 (two) times daily.    Marland Kitchen  clonazePAM (KLONOPIN) 0.5 MG tablet Take 0.5 mg by mouth daily as needed for anxiety.    . Diclofenac Sodium (PENNSAID) 2 % SOLN Place 2 application onto the skin 2 (two) times daily. 112 g 3  . FLUoxetine (PROZAC) 40 MG capsule Take 40 mg by mouth daily. Take 2 tabs po daily    . omeprazole (PRILOSEC) 20 MG capsule Take 1 capsule (20 mg total) by mouth daily. 30 capsule 3  . traZODone (DESYREL) 50 MG tablet Take 50 mg by mouth at bedtime as needed for sleep.    . Vitamin D, Ergocalciferol, (DRISDOL) 50000 units CAPS capsule Take 1  capsule (50,000 Units total) by mouth every 7 (seven) days. 12 capsule 0   No current facility-administered medications on file prior to visit.     BP (!) 122/92 (BP Location: Left Arm, Patient Position: Sitting, Cuff Size: Normal)   Pulse (!) 101   Temp 97.5 F (36.4 C) (Oral)   Wt 173 lb 6.4 oz (78.7 kg)   SpO2 98%   BMI 27.16 kg/m        Objective:   Physical Exam  Constitutional: She is oriented to person, place, and time. She appears well-developed and well-nourished.  HENT:  Mouth/Throat: Oropharynx is clear and moist and mucous membranes are normal.  Eyes: Pupils are equal, round, and reactive to light. No scleral icterus.  Neck: Neck supple.  Cardiovascular: Normal rate, regular rhythm and intact distal pulses.   Pulmonary/Chest: Effort normal and breath sounds normal. She has no wheezes. She has no rales.  Abdominal: Soft. Bowel sounds are normal. There is no tenderness. There is no rebound.  Musculoskeletal: She exhibits no edema.  Lymphadenopathy:    She has no cervical adenopathy.  Neurological: She is alert and oriented to person, place, and time.  Skin: Skin is warm and dry. No rash noted.  Psychiatric: She has a normal mood and affect. Her behavior is normal. Judgment and thought content normal.       Assessment & Plan:  1. Palpitations Exam is reassuring; BP retake is 120/90; HR 95; reviewed EKG with collaborating physician which indicated Sinus tachycardia; EKG noted rate of 96 and atrial flutter however with closer review and consultation with collaborating physician; sinus tachycardia was noted and atrial flutter was not present on EKG strip. P waves present. While patient reports elevated BP readings; diastolic reading of 90 is present with normal systolic readings. Etiology unknown for elevated heart rate as patient denies taking Adderall today; prior history of PTSD and anxiety noted during exam are likely influencing palpitations. Will check CBC, TSH, and  advised patient to drink enough water to keep urine pale yellow or clear. Advised monitoring of BP with an arm cuff for one week; document readings, and follow up if readings are >140/90. We reviewed options of Holter monitoring and patient would like to be referred to cardiology for further evaluation and possibly this type of monitoring. Further advised avoidance of caffeine and provided written information regarding palpitations and stress reducing techniques. Further advised her to seek immediate medical attention if she develops chest pain, SOB, dizziness, or feels as is she is going to faint. - EKG 12-Lead - Ambulatory referral to Cardiology - CBC with Differential/Platelet - Basic metabolic panel  2. Tachycardia Retake of pulse is 95; last TSH 04/15/16 was normal; will recheck today for further evaluation - TSH  Follow up in one week with blood pressure readings or sooner if needed.  Roddie McJulia Doreena Maulden, FNP-C

## 2016-07-12 NOTE — Patient Instructions (Addendum)
Please document blood pressure once daily; document readings, and bring readings to your next visit.   A referral to cardiology has been placed for you as requested for further evaluation and treatment.  We have ordered labs or studies at this visit. It can take up to 1-2 weeks for results and processing. IF results require follow up or explanation, we will call you with instructions. Clinically stable results will be released to your [facility]. If you have not heard from us or cannot find your results in [facility] in 2 weeks please contact our office at [phone].  If you are not yet signed up for [facility], please consider signing up    Palpitations A palpitation is the feeling that your heartbeat is irregular or is faster than normal. It may feel like your heart is fluttering or skipping a beat. Palpitations are usually not a serious problem. They may be caused by many things, including smoking, caffeine, alcohol, stress, and certain medicines. Although most causes of palpitations are not serious, palpitations can be a sign of a serious medical problem. In some cases, you may need further medical evaluation. Follow these instructions at home: Pay attention to any changes in your symptoms. Take these actions to help with your condition:  Avoid the following: ? Caffeinated coffee, tea, soft drinks, diet pills, and energy drinks. ? Chocolate. ? Alcohol.  Do not use any tobacco products, such as cigarettes, chewing tobacco, and e-cigarettes. If you need help quitting, ask your health care provider.  Try to reduce your stress and anxiety. Things that can help you relax include: ? Yoga. ? Meditation. ? Physical activity, such as swimming, jogging, or walking. ? Biofeedback. This is a method that helps you learn to use your mind to control things in your body, such as your heartbeats.  Get plenty of rest and sleep.  Take over-the-counter and prescription medicines only as told by your health care  provider.  Keep all follow-up visits as told by your health care provider. This is important.  Contact a health care provider if:  You continue to have a fast or irregular heartbeat after 24 hours.  Your palpitations occur more often. Get help right away if:  You have chest pain or shortness of breath.  You have a severe headache.  You feel dizzy or you faint. This information is not intended to replace advice given to you by your health care provider. Make sure you discuss any questions you have with your health care provider. Document Released: 01/02/2000 Document Revised: 06/09/2015 Document Reviewed: 09/19/2014 Elsevier Interactive Patient Education  2017 [employer]

## 2016-07-12 NOTE — Telephone Encounter (Signed)
Patient Name: Gina SheffieldMANDA Baxter  DOB: [birth year]    Initial Comment caller states patient is trialing her off her blood pressure medication, her blood pressure has been a little high and her heart rate has been high as well. she has a slight headache.   Nurse Assessment  Nurse: Stefano GaulStringer, RN, Dwana CurdVera Date/Time (Eastern Time): 07/12/2016 2:13:39 PM  Confirm and document reason for call. If symptomatic, describe symptoms. ---Caller states she had just moved here. She had been taking benicar before she moved. Blood pressure has been elevated. Saturday night, she felt like her heart was racing for about 2 hrs. she was sitting reading a book. BP at that time 198/132. Heart rate 150. BP 150/90. Pulse 109 now. No chest pain or SOB. Has a slight headache. Pain level 4-5. She eats healthy and exercises  Does the patient have any new or worsening symptoms? ---Yes  Will a triage be completed? ---Yes  Related visit to physician within the last 2 weeks? ---No  Does the PT have any chronic conditions? (i.e. diabetes, asthma, etc.) ---Yes  List chronic conditions. ---HTN  Is the patient pregnant or possibly pregnant? (Ask all females between the ages of 6612-55) ---No  Is this a behavioral health or substance abuse call? ---No     Guidelines    Guideline Title Affirmed Question Affirmed Notes  High Blood Pressure [1] Systolic BP >= 140 OR Diastolic >= 90 AND [2] not taking BP medications    Final Disposition User   See PCP When Office is Open (within 3 days) Stefano GaulStringer, RN, Dwana CurdVera    Comments  triage outcome upgraded to see physician within 72 hrs as pt had heart of 150 for about 2 hrs on Saturday night with elevated BP  appt scheduled for 07/12/2016 at 3:30 pm with Delbert HarnessJulie Kordsmeier   Referrals  REFERRED TO PCP OFFICE   Disagree/Comply: Danella Maiersomply

## 2016-07-13 ENCOUNTER — Encounter: Payer: Self-pay | Admitting: Family Medicine

## 2016-07-13 LAB — CBC WITH DIFFERENTIAL/PLATELET
BASOS ABS: 0 10*3/uL (ref 0.0–0.1)
Basophils Relative: 0.5 % (ref 0.0–3.0)
EOS PCT: 2.7 % (ref 0.0–5.0)
Eosinophils Absolute: 0.1 10*3/uL (ref 0.0–0.7)
HEMATOCRIT: 36.7 % (ref 36.0–46.0)
Hemoglobin: 12.4 g/dL (ref 12.0–15.0)
LYMPHS PCT: 38.3 % (ref 12.0–46.0)
Lymphs Abs: 1.8 10*3/uL (ref 0.7–4.0)
MCHC: 33.8 g/dL (ref 30.0–36.0)
MCV: 96.4 fl (ref 78.0–100.0)
MONOS PCT: 7.2 % (ref 3.0–12.0)
Monocytes Absolute: 0.3 10*3/uL (ref 0.1–1.0)
Neutro Abs: 2.4 10*3/uL (ref 1.4–7.7)
Neutrophils Relative %: 51.3 % (ref 43.0–77.0)
Platelets: 341 10*3/uL (ref 150.0–400.0)
RBC: 3.81 Mil/uL — AB (ref 3.87–5.11)
RDW: 13.3 % (ref 11.5–15.5)
WBC: 4.7 10*3/uL (ref 4.0–10.5)

## 2016-07-13 LAB — BASIC METABOLIC PANEL
BUN: 30 mg/dL — AB (ref 6–23)
CALCIUM: 10.2 mg/dL (ref 8.4–10.5)
CHLORIDE: 102 meq/L (ref 96–112)
CO2: 25 meq/L (ref 19–32)
CREATININE: 0.93 mg/dL (ref 0.40–1.20)
GFR: 74.29 mL/min (ref >60.00)
GLUCOSE: 105 mg/dL — AB (ref 70–99)
Potassium: 4.2 mEq/L (ref 3.5–5.1)
Sodium: 136 mEq/L (ref 135–145)

## 2016-07-13 LAB — TSH: TSH: 2.02 u[IU]/mL (ref 0.35–4.50)

## 2016-07-13 NOTE — Telephone Encounter (Signed)
Patient was see by Raynelle FanningJulie at the clinic yesterday.

## 2016-07-16 ENCOUNTER — Ambulatory Visit (INDEPENDENT_AMBULATORY_CARE_PROVIDER_SITE_OTHER): Payer: BLUE CROSS/BLUE SHIELD | Admitting: Cardiovascular Disease

## 2016-07-16 ENCOUNTER — Encounter: Payer: Self-pay | Admitting: Cardiovascular Disease

## 2016-07-16 VITALS — BP 152/102 | HR 98 | Ht 67.0 in | Wt 177.0 lb

## 2016-07-16 DIAGNOSIS — I10 Essential (primary) hypertension: Secondary | ICD-10-CM | POA: Diagnosis not present

## 2016-07-16 DIAGNOSIS — R002 Palpitations: Secondary | ICD-10-CM | POA: Diagnosis not present

## 2016-07-16 MED ORDER — NEBIVOLOL HCL 5 MG PO TABS: 5.0000 mg | ORAL_TABLET | Freq: Every day | ORAL | 6 refills | Status: AC

## 2016-07-16 NOTE — Assessment & Plan Note (Signed)
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 There is a family history of hypertension as well. She's had hypertension over the last several years which is demonstrated on home blood pressure monitoring more noticeable in the last 3 months. She avoids salt. She has been on Adderall which is discontinued. Her blood pressures run in the 160/100 range at home. She has been mildly symptomatic recently. Her serum creatinine has slowly crept up from 0.63 2.93 over the last 6 months.I'm going to begin her on low dose Bystolic . She'll keep a daily blood pressure log for the next 3 days and see Gina Baxter to review and titrate. I will see her back in 3 months for follow-up.

## 2016-07-16 NOTE — Assessment & Plan Note (Signed)
Recent episode of palpitations this past weekend while watching a movie. Her heart rate jumped up to the 140-160 range which was sustained for 2 hours and then resolved. This sounds like an episode of PSVT. If it Recurs we will order a 30 day event monitor.

## 2016-07-16 NOTE — Patient Instructions (Signed)
Medication Instructions: Your physician recommends that you continue on your current medications as directed. Please refer to the Current Medication list given to you today.  START Bystolic 5 mg daily   Follow-Up: Your physician recommends that you schedule a follow-up appointment in: 1 month with PharmD.  Your physician recommends that you schedule a follow-up appointment in: 3 months with Dr. Allyson SabalBerry.  If you need a refill on your cardiac medications before your next appointment, please call your pharmacy.

## 2016-07-16 NOTE — Progress Notes (Signed)
07/16/2016 Gina Baxter   1985-05-08  [id number]  Primary Physician Delano Metz, [place] Primary Cardiologist: Lorretta Harp MD Renae Gloss  HPI:  Gina Baxter Is a 31 year old mildly overweight married Caucasian female mother of 2 young daughters ages 57 and 89 was accompanied by her husband met today. She is referred by her primary care provider, Delano Metz  FNP For evaluation of hypertension and palpitations. 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 She has a history of anxiety, depression and PTSD on multiple medications for this. She has no other cardiac risk factors. She's never had a heart attack or stroke. She denies chest pain or shortness f breath. She has limited her caffeine intake and stopped her Adderall recently. She's had hypertension for the last 3 years according to her home blood pressure monitoring were noticeable in the last 3 months. She has never been on antihypertensive medications. There is a family history of hypertension. Her serum creatinine has slowly increased from  .63 to .93 Over the past 6 months.   Current Outpatient Prescriptions  Medication Sig Dispense Refill  . ALTAVERA 0.15-30 MG-MCG tablet Take 1 tablet by mouth daily.  0  . amphetamine-dextroamphetamine (ADDERALL) 20 MG tablet Take 20 mg by mouth as directed.     . busPIRone (BUSPAR) 30 MG tablet Take 30 mg by mouth 2 (two) times daily.    . clonazePAM (KLONOPIN) 0.5 MG tablet Take 0.5 mg by mouth daily as needed for anxiety.    . Diclofenac Sodium (PENNSAID) 2 % SOLN Place 2 application onto the skin 2 (two) times daily. 112 g 3  . FLUoxetine (PROZAC) 40 MG capsule Take 40 mg by mouth daily. Take 2 tabs po daily    . Levonorgestrel-Ethinyl Estrad (ALTAVERA PO) Take by mouth daily.    Marland Kitchen omeprazole (PRILOSEC) 20 MG capsule Take 1 capsule (20 mg total) by mouth daily. 30 capsule 3  . traZODone (DESYREL) 50 MG tablet Take 50 mg by  mouth at bedtime as needed for sleep.    . Vitamin D, Ergocalciferol, (DRISDOL) 50000 units CAPS capsule Take 1 capsule (50,000 Units total) by mouth every 7 (seven) days. 12 capsule 0  . nebivolol (BYSTOLIC) 5 MG tablet Take 1 tablet (5 mg total) by mouth daily. 30 tablet 6   No current facility-administered medications for this visit.     Allergies  Allergen Reactions  . Bee Venom Swelling    swelling  . Gabapentin Swelling  . Silicone Swelling    Social History   Social History  . Marital status: Married    Spouse name: N/A  . Number of children: N/A  . Years of education: N/A   Occupational History  . Not on file.   Social History Main Topics  . Smoking status: Never Smoker  . Smokeless tobacco: Never Used  . Alcohol use 1.2 oz/week    2 Glasses of wine per week  . Drug use: No  . Sexual activity: Yes    Partners: Male    Birth control/ protection: Condom   Other Topics Concern  . Not on file   Social History Narrative   Works as a Marine scientist in a rehab unit     Review of Systems: General: negative for chills, fever, night sweats or weight changes.  Cardiovascular: negative for chest pain, dyspnea on exertion, edema, orthopnea, palpitations, paroxysmal nocturnal dyspnea or shortness of breath Dermatological: negative  for rash Respiratory: negative for cough or wheezing Urologic: negative for hematuria Abdominal: negative for nausea, vomiting, diarrhea, bright red blood per rectum, melena, or hematemesis Neurologic: negative for visual changes, syncope, or dizziness All other systems reviewed and are otherwise negative except as noted above.    Blood pressure (!) 152/102, pulse 98, height 5' 7"  (1.702 m), weight 177 lb (80.3 kg).  General appearance: alert and no distress Neck: no adenopathy, no carotid bruit, no JVD, supple, symmetrical, trachea midline and thyroid not enlarged, symmetric, no tenderness/mass/nodules Lungs: clear to auscultation  bilaterally Heart: regular rate and rhythm, S1, S2 normal, no murmur, click, rub or gallop Extremities: extremities normal, atraumatic, no cyanosis or edema  EKG Sinus rhythm at 98 without ST or T-wave changes. I Personally reviewed this EKG.  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 There is a family history of hypertension as well. She's had hypertension over the last several years which is demonstrated on home blood pressure monitoring more noticeable in the last 3 months. She avoids salt. She has been on Adderall which is discontinued. Her blood pressures run in the 160/100 range at home. She has been mildly symptomatic recently. Her serum creatinine has slowly crept up from 0.63 2.93 over the last 6 months.I'm going to begin her on low dose Bystolic . She'll keep a daily blood pressure log for the next 3 days and see Erasmo Downer to review and titrate. I will see her back in 3 months for follow-up.  Palpitations Recent episode of palpitations this past weekend while watching a movie. Her heart rate jumped up to the 140-160 range which was sustained for 2 hours and then resolved. This sounds like an episode of PSVT. If it Recurs we will order a 30 day event monitor.      Lorretta Harp MD FACP,FACC,FAHA, [facility] 07/16/2016 8:08 AM

## 2016-08-13 ENCOUNTER — Ambulatory Visit: Payer: BLUE CROSS/BLUE SHIELD

## 2016-08-20 ENCOUNTER — Other Ambulatory Visit: Payer: Self-pay | Admitting: Family Medicine

## 2016-10-07 ENCOUNTER — Encounter: Payer: Self-pay | Admitting: Family Medicine

## 2016-10-15 ENCOUNTER — Ambulatory Visit: Payer: BLUE CROSS/BLUE SHIELD | Admitting: Cardiovascular Disease

## 2016-10-22 DIAGNOSIS — F428 Other obsessive-compulsive disorder: Secondary | ICD-10-CM | POA: Diagnosis not present

## 2016-10-22 DIAGNOSIS — F411 Generalized anxiety disorder: Secondary | ICD-10-CM | POA: Diagnosis not present

## 2016-10-22 DIAGNOSIS — F3342 Major depressive disorder, recurrent, in full remission: Secondary | ICD-10-CM | POA: Diagnosis not present

## 2016-10-22 DIAGNOSIS — F4312 Post-traumatic stress disorder, chronic: Secondary | ICD-10-CM | POA: Diagnosis not present

## 2017-01-20 DIAGNOSIS — F3342 Major depressive disorder, recurrent, in full remission: Secondary | ICD-10-CM | POA: Diagnosis not present

## 2017-01-20 DIAGNOSIS — F411 Generalized anxiety disorder: Secondary | ICD-10-CM | POA: Diagnosis not present

## 2017-01-20 DIAGNOSIS — F4312 Post-traumatic stress disorder, chronic: Secondary | ICD-10-CM | POA: Diagnosis not present

## 2017-01-20 DIAGNOSIS — F4001 Agoraphobia with panic disorder: Secondary | ICD-10-CM | POA: Diagnosis not present

## 2017-02-21 ENCOUNTER — Telehealth: Payer: Self-pay | Admitting: *Deleted

## 2017-02-21 NOTE — Telephone Encounter (Signed)
Patient Name: Margaretha SheffieldMANDA Hanton Gender: Female DOB: 1985-04-16 Age: [age] Y 6 M 12 D Return Phone Number: [phone] (Primary), [phone] (Secondary) Address: City/State/Zip: [place] [id number] Client [facility] Night - Client Client Site [facility] - Night Physician Delbert HarnessKordsmeier, Julie- NP Contact Type Call Who Is Calling Patient / Member / Family / Caregiver Call Type Triage / Clinical Relationship To Patient Self Return Phone Number [phone] (Secondary) Chief Complaint Vision - (non urgent symptoms) Reason for Call Symptomatic / Request for Health Information Initial Comment Caller states, she woke up this morning - seeing something visual disturbance. Seeing past where her arm is moving. No blurry or a shadow. Half a second longer to catch up. Vision moves past her hand. No confusion. GOTO Facility Not Listed Fast Med [facility] Disp. Time Lamount Cohen(Eastern Time) Disposition Final User 02/19/2017 2:10:25 PM Go to ED Now (or PCP triage) Yes Larmer, RN, Lupita Leashonna

## 2017-02-21 NOTE — Telephone Encounter (Signed)
Called patient and left message to return call. Patient did not go to [facility] ED[facility]UC.

## 2017-04-13 ENCOUNTER — Other Ambulatory Visit: Payer: Self-pay | Admitting: Cardiovascular Disease

## 2017-04-14 ENCOUNTER — Ambulatory Visit: Payer: BLUE CROSS/BLUE SHIELD | Admitting: Family Medicine

## 2017-04-14 ENCOUNTER — Encounter: Payer: Self-pay | Admitting: Family Medicine

## 2017-04-14 VITALS — BP 138/82 | HR 91 | Temp 98.3°F | Ht 67.0 in | Wt 157.0 lb

## 2017-04-14 DIAGNOSIS — M25511 Pain in right shoulder: Secondary | ICD-10-CM

## 2017-04-14 MED ORDER — PREDNISONE 5 MG PO TABS: ORAL_TABLET | ORAL | 0 refills | Status: AC

## 2017-04-14 NOTE — Assessment & Plan Note (Signed)
Don't appreciate a tear in the rotator cuff. Possible for strain  - prednisone  - counseled on HEP  - work note provided today  - if no improvement consider imaging, or injection or PT

## 2017-04-14 NOTE — Patient Instructions (Signed)
Please try to ice the shoulder  Please try the exercises once the pain is less than 2/10.  Please follow up with me in 4 weeks if there is no improvement.

## 2017-04-14 NOTE — Progress Notes (Signed)
Gina Baxter - 32 y.o. female MRN [id number]  Date of birth: 03-25-1985  SUBJECTIVE:  Including CC & ROS.  Chief Complaint  Patient presents with  . Right shoulder pain    Gina Baxter is a 32 y.o. female that is presenting with right shoulder pain. Pain has been ongoing for five days. Pain started the day after she was jerked by her dog. Pain is located on the lateral aspect of her right shoulder. Admits to tinging and numbness. Pain is mild to severe. Severe upon flexion and extension. She has been taking motrin with some improvement. Admits to decrease range of motion. Denies previous surgeries.  Review of Systems  Constitutional: Negative for fever.  HENT: Negative for congestion.   Cardiovascular: Negative for chest pain.  Gastrointestinal: Negative for abdominal pain.  Musculoskeletal: Negative for back pain.  Skin: Negative for color change.  Allergic/Immunologic: Negative for immunocompromised state.  Neurological: Positive for numbness.  Hematological: Negative for adenopathy.  Psychiatric/Behavioral: Negative for agitation.    HISTORY: Past Medical, Surgical, Social, and Family History Reviewed & Updated per EMR.   Pertinent Historical Findings include:  Past Medical History:  Diagnosis Date  . Anxiety   . Hypertension   . PTSD (post-traumatic stress disorder)     Past Surgical History:  Procedure Laterality Date  . APPENDECTOMY    . CHOLECYSTECTOMY    . TONSILLECTOMY      Allergies  Allergen Reactions  . Bee Venom Swelling    swelling  . Gabapentin Swelling  . Silicone Swelling    Family History  Problem Relation Age of Onset  . Hypertension Mother   . Hyperlipidemia Mother   . Hypertension Father   . Diabetes Father   . Diabetes Maternal Grandmother      Social History   Socioeconomic History  . Marital status: Married    Spouse name: Not on file  . Number of children: Not on file  . Years of education: Not on file  . Highest education  level: Not on file  Occupational History  . Not on file  Social Needs  . Financial resource strain: Not on file  . Food insecurity:    Worry: Not on file    Inability: Not on file  . Transportation needs:    Medical: Not on file    Non-medical: Not on file  Tobacco Use  . Smoking status: Never Smoker  . Smokeless tobacco: Never Used  Substance and Sexual Activity  . Alcohol use: Yes    Alcohol/week: 1.2 oz    Types: 2 Glasses of wine per week  . Drug use: No  . Sexual activity: Yes    Partners: Male    Birth control/protection: Condom  Lifestyle  . Physical activity:    Days per week: Not on file    Minutes per session: Not on file  . Stress: Not on file  Relationships  . Social connections:    Talks on phone: Not on file    Gets together: Not on file    Attends religious service: Not on file    Active member of club or organization: Not on file    Attends meetings of clubs or organizations: Not on file    Relationship status: Not on file  . Intimate partner violence:    Fear of current or ex partner: Not on file    Emotionally abused: Not on file    Physically abused: Not on file    Forced sexual activity: Not on  file  Other Topics Concern  . Not on file  Social History Narrative   Works as a Engineer, civil (consulting)nurse in a rehab unit     PHYSICAL EXAM:  VS: BP 138/82 (BP Location: Left Arm, Patient Position: Sitting, Cuff Size: Normal)   Pulse 91   Temp 98.3 F (36.8 C) (Oral)   Ht 5\' 7"  (1.702 m)   Wt 157 lb (71.2 kg)   SpO2 98%   BMI 24.59 kg/m  Physical Exam Gen: NAD, alert, cooperative with exam, well-appearing ENT: normal lips, normal nasal mucosa,  Eye: normal EOM, normal conjunctiva and lids CV:  no edema, +2 pedal pulses   Resp: no accessory muscle use, non-labored,  Skin: no rashes, no areas of induration  Neuro: normal tone, normal sensation to touch Psych:  normal insight, alert and oriented MSK:  Right shoulder:  Limited active flexion and extension    Normal passive flexion and extension  Normal ER  Normal IR and ER strength to resistance  Pain with empty can  Neurovascularly intact   Limited ultrasound: right shoulder:  Normal appearing BT and subscap  Normal supraspinatus in static and dynamic testing   Summary: normal exam.   Ultrasound and interpretation by Clare GandyJeremy Schmitz, MD            ASSESSMENT & PLAN:   Acute pain of right shoulder Don't appreciate a tear in the rotator cuff. Possible for strain  - prednisone  - counseled on HEP  - work note provided today  - if no improvement consider imaging, or injection or PT

## 2017-04-18 ENCOUNTER — Telehealth: Payer: Self-pay | Admitting: *Deleted

## 2017-04-18 NOTE — Telephone Encounter (Signed)
Called to schedule appointment with Dr. Romero BellingBerry---voice mail has not been set up

## 2017-04-19 DIAGNOSIS — F422 Mixed obsessional thoughts and acts: Secondary | ICD-10-CM | POA: Diagnosis not present

## 2017-04-19 DIAGNOSIS — F900 Attention-deficit hyperactivity disorder, predominantly inattentive type: Secondary | ICD-10-CM | POA: Diagnosis not present

## 2017-04-19 DIAGNOSIS — F3342 Major depressive disorder, recurrent, in full remission: Secondary | ICD-10-CM | POA: Diagnosis not present

## 2017-04-19 DIAGNOSIS — F411 Generalized anxiety disorder: Secondary | ICD-10-CM | POA: Diagnosis not present

## 2017-04-26 ENCOUNTER — Ambulatory Visit: Payer: BLUE CROSS/BLUE SHIELD | Admitting: Cardiovascular Disease

## 2017-04-27 NOTE — Progress Notes (Signed)
[name] [address] [place], [place] [address] Phone: [phone] Subjective:    I'm seeing this patient by the request  of:    CC: Right shoulder pain  BJY:[id number]  Gina Baxter is a 32 y.o. female coming in with complaint of right shoulder pain.  Patient states that she has had this for quite some time.  Going over 1 month.  Saw another provider but feels that the exercises she is doing is not improving.  Saw me over a year ago and did have an acute bursitis of the right shoulder as well as pain slipped rib syndrome.  Did respond somewhat to manipulation.  Patient states that the pain is so severe that she is unable to move the arm much.  Denies though any weakness.  Waking up at night.  Rates the severity of pain is 9 out of 10       Past Medical History:  Diagnosis Date  . Anxiety   . Hypertension   . PTSD (post-traumatic stress disorder)    Past Surgical History:  Procedure Laterality Date  . APPENDECTOMY    . CHOLECYSTECTOMY    . TONSILLECTOMY     Social History   Socioeconomic History  . Marital status: Married    Spouse name: Not on file  . Number of children: Not on file  . Years of education: Not on file  . Highest education level: Not on file  Occupational History  . Not on file  Social Needs  . Financial resource strain: Not on file  . Food insecurity:    Worry: Not on file    Inability: Not on file  . Transportation needs:    Medical: Not on file    Non-medical: Not on file  Tobacco Use  . Smoking status: Never Smoker  . Smokeless tobacco: Never Used  Substance and Sexual Activity  . Alcohol use: Yes    Alcohol/week: 1.2 oz    Types: 2 Glasses of wine per week  . Drug use: No  . Sexual activity: Yes    Partners: Male    Birth control/protection: Condom  Lifestyle  . Physical activity:    Days per week: Not on file    Minutes per session: Not on file  . Stress: Not on file  Relationships  . Social  connections:    Talks on phone: Not on file    Gets together: Not on file    Attends religious service: Not on file    Active member of club or organization: Not on file    Attends meetings of clubs or organizations: Not on file    Relationship status: Not on file  Other Topics Concern  . Not on file  Social History Narrative   Works as a Engineer, civil (consulting) in a rehab unit   Allergies  Allergen Reactions  . Bee Venom Swelling    swelling  . Gabapentin Swelling  . Silicone Swelling   Family History  Problem Relation Age of Onset  . Hypertension Mother   . Hyperlipidemia Mother   . Hypertension Father   . Diabetes Father   . Diabetes Maternal Grandmother      Past medical history, social, surgical and family history all reviewed in electronic medical record.  No pertanent information unless stated regarding to the chief complaint.   Review of Systems:Review of systems updated and as accurate as of 04/28/17  No headache, visual changes, nausea, vomiting, diarrhea, constipation, dizziness, abdominal pain,  skin rash, fevers, chills, night sweats, weight loss, swollen lymph nodes, body aches, joint swelling, muscle aches, chest pain, shortness of breath, mood changes.  Positive muscle aches  Objective  Blood pressure 102/80, pulse 63, height 5\' 7"  (1.702 m), weight 156 lb (70.8 kg), SpO2 96 %. Systems examined below as of 04/28/17   General: No apparent distress alert and oriented x3 mood and affect normal, dressed appropriately.  HEENT: Pupils equal, extraocular movements intact  Respiratory: Patient's speak in full sentences and does not appear short of breath  Cardiovascular: No lower extremity edema, non tender, no erythema  Skin: Warm dry intact with no signs of infection or rash on extremities or on axial skeleton.  Abdomen: Soft nontender  Neuro: Cranial nerves II through XII are intact, neurovascularly intact in all extremities with 2+ DTRs and 2+ pulses.  Lymph: No lymphadenopathy  of posterior or anterior cervical chain or axillae bilaterally.  Gait normal with good balance and coordination.  MSK:  Non tender with full range of motion and good stability and symmetric strength and tone of  elbows, wrist, hip, knee and ankles bilaterally.  Shoulder: Right Inspection reveals no abnormalities, atrophy or asymmetry. Palpation is normal with no tenderness over AC joint or bicipital groove. ROM is full in all planes passively. Rotator cuff strength normal throughout. signs of impingement with positive Neer and Hawkin's tests, but negative empty can sign. Speeds and Yergason's tests normal. No labral pathology noted with negative Obrien's, negative clunk and good stability. Normal scapular function observed. No painful arc and no drop arm sign. No apprehension sign  MSK US performed of: Right This study was ordered, performed, and interpreted by Terrilee Files D.O.  Shoulder:   Supraspinatus:  Appears normal on long and transverse views, Bursal bulge seen with shoulder abduction on impingement view. Infraspinatus:  Appears normal on long and transverse views. Significant increase in Doppler flow Subscapularis:  Appears normal on long and transverse views. Positive bursa Teres Minor:  Appears normal on long and transverse views. AC joint:  Capsule undistended, no geyser sign. Glenohumeral Joint:  Appears normal without effusion. Glenoid Labrum:  Intact without visualized tears. Biceps Tendon:  Appears normal on long and transverse views, no fraying of tendon, tendon located in intertubercular groove, no subluxation with shoulder internal or external rotation.  Impression: Subacromial bursitis  Procedure: Real-time Ultrasound Guided Injection of right glenohumeral joint Device: GE Logiq E  Ultrasound guided injection is preferred based studies that show increased duration, increased effect, greater accuracy, decreased procedural pain, increased response rate with ultrasound  guided versus blind injection.  Verbal informed consent obtained.  Time-out conducted.  Noted no overlying erythema, induration, or other signs of local infection.  Skin prepped in a sterile fashion.  Local anesthesia: Topical Ethyl chloride.  With sterile technique and under real time ultrasound guidance:  Joint visualized.  23g 1  inch needle inserted posterior approach. Pictures taken for needle placement. Patient did have injection of 2 cc of 1% lidocaine, 2 cc of 0.5% Marcaine, and 1.0 cc of Kenalog 40 mg/dL. Completed without difficulty  Pain immediately resolved suggesting accurate placement of the medication.  Advised to call if fevers/chills, erythema, induration, drainage, or persistent bleeding.  Images permanently stored and available for review in the ultrasound unit.  Impression: Technically successful ultrasound guided injection.     [id number]; 15 additional minutes spent for Therapeutic exercises as stated in above notes.  This included exercises focusing on stretching, strengthening, with significant focus on eccentric aspects.  Long term goals include an improvement in range of motion, strength, endurance as well as avoiding reinjury. Patient's frequency would include in 1-2 times a day, 3-5 times a week for a duration of 6-12 weeks. Shoulder Exercises that included:  Basic scapular stabilization to include adduction and depression of scapula Scaption, focusing on proper movement and good control Internal and External rotation utilizing a theraband, with elbow tucked at side entire time Rows with theraband   Proper technique shown and discussed handout in great detail with ATC.  All questions were discussed and answered.   Impression and Recommendations:     This case required medical decision making of moderate complexity.      Note: This dictation was prepared with Dragon dictation along with smaller phrase technology. Any transcriptional errors that result from this  process are unintentional.

## 2017-04-28 ENCOUNTER — Encounter: Payer: Self-pay | Admitting: Family Medicine

## 2017-04-28 ENCOUNTER — Ambulatory Visit: Payer: Self-pay

## 2017-04-28 ENCOUNTER — Ambulatory Visit: Payer: BLUE CROSS/BLUE SHIELD | Admitting: Family Medicine

## 2017-04-28 VITALS — BP 102/80 | HR 63 | Ht 67.0 in | Wt 156.0 lb

## 2017-04-28 DIAGNOSIS — G8929 Other chronic pain: Secondary | ICD-10-CM

## 2017-04-28 DIAGNOSIS — M25519 Pain in unspecified shoulder: Secondary | ICD-10-CM

## 2017-04-28 DIAGNOSIS — M7551 Bursitis of right shoulder: Secondary | ICD-10-CM

## 2017-04-28 NOTE — Patient Instructions (Signed)
Good to see you  Tried injection in the shoulder Start exercises on Monday  Ice 20 minutes 2 times daily. Usually after activity and before bed. See me again in 4 weeks

## 2017-04-28 NOTE — Assessment & Plan Note (Signed)
Patient given injection.  Tolerated the procedure well.  Discussed icing regimen and home exercises.  Discussed which activities to do which wants to avoid.  Patient will increase activity slowly over the course the next several days.  Follow-up with me again 4-6 weeks.

## 2017-05-16 DIAGNOSIS — J22 Unspecified acute lower respiratory infection: Secondary | ICD-10-CM | POA: Diagnosis not present

## 2017-05-18 ENCOUNTER — Ambulatory Visit: Payer: BLUE CROSS/BLUE SHIELD | Admitting: Cardiovascular Disease

## 2017-05-19 ENCOUNTER — Encounter: Payer: Self-pay | Admitting: *Deleted

## 2017-05-20 ENCOUNTER — Ambulatory Visit: Payer: BLUE CROSS/BLUE SHIELD | Admitting: Family Medicine

## 2017-05-20 ENCOUNTER — Encounter: Payer: Self-pay | Admitting: Family Medicine

## 2017-05-20 VITALS — BP 140/90 | HR 78 | Ht 67.0 in | Wt 159.0 lb

## 2017-05-20 DIAGNOSIS — M25519 Pain in unspecified shoulder: Secondary | ICD-10-CM

## 2017-05-20 DIAGNOSIS — M7551 Bursitis of right shoulder: Secondary | ICD-10-CM

## 2017-05-20 DIAGNOSIS — G8929 Other chronic pain: Secondary | ICD-10-CM

## 2017-05-20 NOTE — Assessment & Plan Note (Signed)
Responded well to the injection.  Discussed with patient to follow-up in 6 weeks if wanting.  Patient will start physical therapy and sent to the facility the patient suggested.

## 2017-05-20 NOTE — Patient Instructions (Signed)
You are awesome OK to do anything but keep hands within peripheral vision  You will do great  See me again in 6 weeks just in case

## 2017-05-20 NOTE — Progress Notes (Signed)
[name] [address] [place], [place] [address] Phone: [phone] Subjective:     CC: Right shoulder pain follow-up  QMV:[id number]  Gina Baxter is a 32 y.o. female coming in with complaint of right shoulder pain.  Patient was found to have more of a right shoulder bursitis as well as slipped rib syndrome.  Patient is responded very well though after the injection.  States that she is 95% better.  Still some mild discomfort with certain range of motion but overall feeling significantly better.     Past Medical History:  Diagnosis Date  . Anxiety   . Hypertension   . PTSD (post-traumatic stress disorder)    Past Surgical History:  Procedure Laterality Date  . APPENDECTOMY    . CHOLECYSTECTOMY    . TONSILLECTOMY     Social History   Socioeconomic History  . Marital status: Married    Spouse name: Not on file  . Number of children: Not on file  . Years of education: Not on file  . Highest education level: Not on file  Occupational History  . Not on file  Social Needs  . Financial resource strain: Not on file  . Food insecurity:    Worry: Not on file    Inability: Not on file  . Transportation needs:    Medical: Not on file    Non-medical: Not on file  Tobacco Use  . Smoking status: Never Smoker  . Smokeless tobacco: Never Used  Substance and Sexual Activity  . Alcohol use: Yes    Alcohol/week: 1.2 oz    Types: 2 Glasses of wine per week  . Drug use: No  . Sexual activity: Yes    Partners: Male    Birth control/protection: Condom  Lifestyle  . Physical activity:    Days per week: Not on file    Minutes per session: Not on file  . Stress: Not on file  Relationships  . Social connections:    Talks on phone: Not on file    Gets together: Not on file    Attends religious service: Not on file    Active member of club or organization: Not on file    Attends meetings of clubs or organizations: Not on file    Relationship  status: Not on file  Other Topics Concern  . Not on file  Social History Narrative   Works as a Engineer, civil (consulting) in a rehab unit   Allergies  Allergen Reactions  . Bee Venom Swelling    swelling  . Gabapentin Swelling  . Silicone Swelling   Family History  Problem Relation Age of Onset  . Hypertension Mother   . Hyperlipidemia Mother   . Hypertension Father   . Diabetes Father   . Diabetes Maternal Grandmother      Past medical history, social, surgical and family history all reviewed in electronic medical record.  No pertanent information unless stated regarding to the chief complaint.   Review of Systems:Review of systems updated and as accurate as of 05/20/17  No headache, visual changes, nausea, vomiting, diarrhea, constipation, dizziness, abdominal pain, skin rash, fevers, chills, night sweats, weight loss, swollen lymph nodes, body aches, joint swelling, muscle aches, chest pain, shortness of breath, mood changes.  Positive muscle aches  Objective  Blood pressure 140/90, pulse 78, height  (1.702 m), weight 159 lb (72.1 kg), SpO2 97 %. Systems examined below as of 05/20/17   General: No apparent distress alert and  oriented x3 mood and affect normal, dressed appropriately.  HEENT: Pupils equal, extraocular movements intact  Respiratory: Patient's speak in full sentences and does not appear short of breath  Cardiovascular: No lower extremity edema, non tender, no erythema  Skin: Warm dry intact with no signs of infection or rash on extremities or on axial skeleton.  Abdomen: Soft nontender  Neuro: Cranial nerves II through XII are intact, neurovascularly intact in all extremities with 2+ DTRs and 2+ pulses.  Lymph: No lymphadenopathy of posterior or anterior cervical chain or axillae bilaterally.  Gait normal with good balance and coordination.  MSK:  Non tender with full range of motion and good stability and symmetric strength and tone of  elbows, wrist, hip, knee and ankles  bilaterally.   Patient shoulder exam shows left patient is very mild impingement still remaining especially with internal range of motion.  Very minimal overall though.        Impression and Recommendations:     This case required medical decision making of moderate complexity.      Note: This dictation was prepared with Dragon dictation along with smaller phrase technology. Any transcriptional errors that result from this process are unintentional.

## 2017-06-30 NOTE — Progress Notes (Signed)
[name] [address] [place], [place] [address] Phone: [phone] Subjective:     CC:  Shoulder pain   BJY:NWGNFAOZHY  Gina Baxter is a 32 y.o. female coming in with complaint of shoulder pain. She had a decrease in her pain for a few weeks but the pain started to increase over the past 2 weeks. Low intensity pain when she wakes up but the intensity increases by the end of the day. Has been using ice to alleviate pain. Pain is still located on the superior and posterior shoulder.      Past Medical History:  Diagnosis Date  . Anxiety   . Hypertension   . PTSD (post-traumatic stress disorder)    Past Surgical History:  Procedure Laterality Date  . APPENDECTOMY    . CHOLECYSTECTOMY    . TONSILLECTOMY     Social History   Socioeconomic History  . Marital status: Married    Spouse name: Not on file  . Number of children: Not on file  . Years of education: Not on file  . Highest education level: Not on file  Occupational History  . Not on file  Social Needs  . Financial resource strain: Not on file  . Food insecurity:    Worry: Not on file    Inability: Not on file  . Transportation needs:    Medical: Not on file    Non-medical: Not on file  Tobacco Use  . Smoking status: Never Smoker  . Smokeless tobacco: Never Used  Substance and Sexual Activity  . Alcohol use: Yes    Alcohol/week: 1.2 oz    Types: 2 Glasses of wine per week  . Drug use: No  . Sexual activity: Yes    Partners: Male    Birth control/protection: Condom  Lifestyle  . Physical activity:    Days per week: Not on file    Minutes per session: Not on file  . Stress: Not on file  Relationships  . Social connections:    Talks on phone: Not on file    Gets together: Not on file    Attends religious service: Not on file    Active member of club or organization: Not on file    Attends meetings of clubs or organizations: Not on file    Relationship status: Not on  file  Other Topics Concern  . Not on file  Social History Narrative   Works as a Engineer, civil (consulting) in a rehab unit   Allergies  Allergen Reactions  . Bee Venom Swelling    swelling  . Gabapentin Swelling  . Silicone Swelling   Family History  Problem Relation Age of Onset  . Hypertension Mother   . Hyperlipidemia Mother   . Hypertension Father   . Diabetes Father   . Diabetes Maternal Grandmother      Past medical history, social, surgical and family history all reviewed in electronic medical record.  No pertanent information unless stated regarding to the chief complaint.   Review of Systems:Review of systems updated and as accurate as of 07/01/17  No headache, visual changes, nausea, vomiting, diarrhea, constipation, dizziness, abdominal pain, skin rash, fevers, chills, night sweats, weight loss, swollen lymph nodes, body aches, joint swelling, muscle aches, chest pain, shortness of breath, mood changes.   Objective  Blood pressure 122/88, pulse 92, height 5\' 7"  (1.702 m), weight 156 lb (70.8 kg), SpO2 96 %. Systems examined below as of 07/01/17   General: No apparent  distress alert and oriented x3 mood and affect normal, dressed appropriately.  HEENT: Pupils equal, extraocular movements intact  Respiratory: Patient's speak in full sentences and does not appear short of breath  Cardiovascular: No lower extremity edema, non tender, no erythema  Skin: Warm dry intact with no signs of infection or rash on extremities or on axial skeleton.  Abdomen: Soft nontender  Neuro: Cranial nerves II through XII are intact, neurovascularly intact in all extremities with 2+ DTRs and 2+ pulses.  Lymph: No lymphadenopathy of posterior or anterior cervical chain or axillae bilaterally.  Gait normal with good balance and coordination.  MSK:  Non tender with full range of motion and good stability and symmetric strength and tone of  elbows, wrist, hip, knee and ankles bilaterally.  Shoulder:  Right Inspection reveals no abnormalities, atrophy or asymmetry. Palpation is normal with no tenderness over AC joint or bicipital groove. ROM is full in all planes. Rotator cuff strength normal throughout. Impingement positive Speeds and Yergason's tests normal. Positive right Normal scapular function observed. No painful arc and no drop arm sign. No apprehension sign Contralateral shoulder unremarkable  Neck: Inspection loss of lordosis. No palpable stepoffs. Negative Spurling's maneuver. Full neck range of motion Grip strength and sensation normal in bilateral hands Strength good C4 to T1 distribution No sensory change to C4 to T1 Negative Hoffman sign bilaterally Reflexes normal Positive right trapezius  After informed written and verbal consent, patient was seated on exam table. Right shoulder was prepped with alcohol swab and utilizing posterior approach, patient's right glenohumeral space was injected with 4:1  marcaine 0.5%: Kenalog 40mg /dL. Patient tolerated the procedure well without immediate complications.  Osteopathic findings C2 flexed rotated and side bent right C4 flexed rotated and side bent left T3 extended rotated and side bent right inhaled third rib L3 flexed rotated and side bent right Sacrum right on right      Impression and Recommendations:     This case required medical decision making of moderate complexity.      Note: This dictation was prepared with Dragon dictation along with smaller phrase technology. Any transcriptional errors that result from this process are unintentional.

## 2017-07-01 ENCOUNTER — Ambulatory Visit: Payer: BLUE CROSS/BLUE SHIELD | Admitting: Family Medicine

## 2017-07-01 ENCOUNTER — Encounter: Payer: Self-pay | Admitting: Family Medicine

## 2017-07-01 VITALS — BP 122/88 | HR 92 | Ht 67.0 in | Wt 156.0 lb

## 2017-07-01 DIAGNOSIS — M25511 Pain in right shoulder: Secondary | ICD-10-CM | POA: Diagnosis not present

## 2017-07-01 DIAGNOSIS — M7551 Bursitis of right shoulder: Secondary | ICD-10-CM | POA: Diagnosis not present

## 2017-07-01 DIAGNOSIS — G8929 Other chronic pain: Secondary | ICD-10-CM

## 2017-07-01 NOTE — Assessment & Plan Note (Signed)
Given injection.  Discussed icing regimen and home exercise.  Discussed which activities to do which wants to avoid.  Patient is to increase activity as tolerated.

## 2017-07-01 NOTE — Patient Instructions (Addendum)
Good to see you  Xray downstairs Injected the hsoulder again  Ice is yoru friend See em again in 4 weeks and if not better we may need to consider MRI

## 2017-07-12 DIAGNOSIS — F411 Generalized anxiety disorder: Secondary | ICD-10-CM | POA: Diagnosis not present

## 2017-07-12 DIAGNOSIS — F4001 Agoraphobia with panic disorder: Secondary | ICD-10-CM | POA: Diagnosis not present

## 2017-07-12 DIAGNOSIS — F3342 Major depressive disorder, recurrent, in full remission: Secondary | ICD-10-CM | POA: Diagnosis not present

## 2017-07-12 DIAGNOSIS — F4312 Post-traumatic stress disorder, chronic: Secondary | ICD-10-CM | POA: Diagnosis not present

## 2017-08-01 NOTE — Progress Notes (Signed)
[name] [address] [place], [place] [address] Phone: [phone] Subjective:     CC: Right shoulder and neck pain follow-up  BJY:NWGNFAOZHY  Gina Baxter is a 32 y.o. female coming in with complaint of right shoulder and back pain.  Patient has been seen previously.  Did have more of a shoulder bursitis and given an injection.  States about 50% better.  Still having some discomfort and seems to be worse at night.  Patient states mild radiation of the arm as well.  Discussed posture and ergonomics been doing them somewhat.  Feels like she can feel when her ribs seems to slip out of position.     Past Medical History:  Diagnosis Date  . Anxiety   . Hypertension   . PTSD (post-traumatic stress disorder)    Past Surgical History:  Procedure Laterality Date  . APPENDECTOMY    . CHOLECYSTECTOMY    . TONSILLECTOMY     Social History   Socioeconomic History  . Marital status: Married    Spouse name: Not on file  . Number of children: Not on file  . Years of education: Not on file  . Highest education level: Not on file  Occupational History  . Not on file  Social Needs  . Financial resource strain: Not on file  . Food insecurity:    Worry: Not on file    Inability: Not on file  . Transportation needs:    Medical: Not on file    Non-medical: Not on file  Tobacco Use  . Smoking status: Never Smoker  . Smokeless tobacco: Never Used  Substance and Sexual Activity  . Alcohol use: Yes    Alcohol/week: 1.2 oz    Types: 2 Glasses of wine per week  . Drug use: No  . Sexual activity: Yes    Partners: Male    Birth control/protection: Condom  Lifestyle  . Physical activity:    Days per week: Not on file    Minutes per session: Not on file  . Stress: Not on file  Relationships  . Social connections:    Talks on phone: Not on file    Gets together: Not on file    Attends religious service: Not on file    Active member of club or  organization: Not on file    Attends meetings of clubs or organizations: Not on file    Relationship status: Not on file  Other Topics Concern  . Not on file  Social History Narrative   Works as a Engineer, civil (consulting) in a rehab unit   Allergies  Allergen Reactions  . Bee Venom Swelling    swelling  . Gabapentin Swelling  . Silicone Swelling   Family History  Problem Relation Age of Onset  . Hypertension Mother   . Hyperlipidemia Mother   . Hypertension Father   . Diabetes Father   . Diabetes Maternal Grandmother      Past medical history, social, surgical and family history all reviewed in electronic medical record.  No pertanent information unless stated regarding to the chief complaint.   Review of Systems:Review of systems updated and as accurate as of 08/02/17  No headache, visual changes, nausea, vomiting, diarrhea, constipation, dizziness, abdominal pain, skin rash, fevers, chills, night sweats, weight loss, swollen lymph nodes, body aches, joint swelling,  chest pain, shortness of breath, mood changes.  Positive muscle aches  Objective  Blood pressure 124/90, pulse 75, height 5\' 7"  (1.702 m),  weight 157 lb (71.2 kg), last menstrual period 08/01/2017, SpO2 98 %. Systems examined below as of 08/02/17   General: No apparent distress alert and oriented x3 mood and affect normal, dressed appropriately.  HEENT: Pupils equal, extraocular movements intact  Respiratory: Patient's speak in full sentences and does not appear short of breath  Cardiovascular: No lower extremity edema, non tender, no erythema  Skin: Warm dry intact with no signs of infection or rash on extremities or on axial skeleton.  Abdomen: Soft nontender  Neuro: Cranial nerves II through XII are intact, neurovascularly intact in all extremities with 2+ DTRs and 2+ pulses.  Lymph: No lymphadenopathy of posterior or anterior cervical chain or axillae bilaterally.  Gait normal with good balance and coordination.  MSK:  Non  tender with full range of motion and good stability and symmetric strength and tone of  elbows, wrist, hip, knee and ankles bilaterally.  Shoulder: Right Inspection reveals no abnormalities, atrophy or asymmetry. Palpation is normal with no tenderness over AC joint or bicipital groove. ROM is full in all planes passively. Rotator cuff strength normal throughout. signs of impingement with positive Neer and Hawkin's tests, but negative empty can sign. Speeds and Yergason's tests normal. No labral pathology noted with negative Obrien's, negative clunk and good stability. Normal scapular function observed. No painful arc and no drop arm sign. No apprehension sign Tightness of the right trapezius and patient does have some signs with thoracic outlet syndrome. Contralateral shoulder unremarkable   Osteopathic findings C2 flexed rotated and side bent right C4 flexed rotated and side bent left C6 flexed rotated and side bent left T3 extended rotated and side bent right inhaled third rib T6 extended rotated and side bent left L2 flexed rotated and side bent right Sacrum right on right    Impression and Recommendations:     This case required medical decision making of moderate complexity.      Note: This dictation was prepared with Dragon dictation along with smaller phrase technology. Any transcriptional errors that result from this process are unintentional.

## 2017-08-02 ENCOUNTER — Ambulatory Visit: Payer: BLUE CROSS/BLUE SHIELD | Admitting: Family Medicine

## 2017-08-02 ENCOUNTER — Ambulatory Visit (INDEPENDENT_AMBULATORY_CARE_PROVIDER_SITE_OTHER)
Admission: RE | Admit: 2017-08-02 | Discharge: 2017-08-02 | Disposition: A | Discharge status: Home / Self Care | Payer: BLUE CROSS/BLUE SHIELD | Source: Ambulatory Visit | Attending: Family Medicine | Admitting: Family Medicine

## 2017-08-02 ENCOUNTER — Encounter: Payer: Self-pay | Admitting: Family Medicine

## 2017-08-02 ENCOUNTER — Other Ambulatory Visit (INDEPENDENT_AMBULATORY_CARE_PROVIDER_SITE_OTHER): Payer: BLUE CROSS/BLUE SHIELD

## 2017-08-02 VITALS — BP 124/90 | HR 75 | Ht 67.0 in | Wt 157.0 lb

## 2017-08-02 DIAGNOSIS — M999 Biomechanical lesion, unspecified: Secondary | ICD-10-CM | POA: Insufficient documentation

## 2017-08-02 DIAGNOSIS — M940 Chondrocostal junction syndrome [Tietze]: Secondary | ICD-10-CM | POA: Insufficient documentation

## 2017-08-02 DIAGNOSIS — M2550 Pain in unspecified joint: Secondary | ICD-10-CM | POA: Diagnosis not present

## 2017-08-02 DIAGNOSIS — M7551 Bursitis of right shoulder: Secondary | ICD-10-CM | POA: Diagnosis not present

## 2017-08-02 DIAGNOSIS — M25511 Pain in right shoulder: Secondary | ICD-10-CM

## 2017-08-02 DIAGNOSIS — G8929 Other chronic pain: Secondary | ICD-10-CM

## 2017-08-02 LAB — TSH: TSH: 3.51 u[IU]/mL (ref 0.35–4.50)

## 2017-08-02 LAB — CBC WITH DIFFERENTIAL/PLATELET
BASOS PCT: 0.4 % (ref 0.0–3.0)
Basophils Absolute: 0 10*3/uL (ref 0.0–0.1)
EOS PCT: 0 % (ref 0.0–5.0)
Eosinophils Absolute: 0 10*3/uL (ref 0.0–0.7)
HCT: 35.9 % — ABNORMAL LOW (ref 36.0–46.0)
Hemoglobin: 12.3 g/dL (ref 12.0–15.0)
LYMPHS ABS: 1.4 10*3/uL (ref 0.7–4.0)
Lymphocytes Relative: 31.6 % (ref 12.0–46.0)
MCHC: 34.2 g/dL (ref 30.0–36.0)
MCV: 90.4 fl (ref 78.0–100.0)
MONO ABS: 0.3 10*3/uL (ref 0.1–1.0)
Monocytes Relative: 7.1 % (ref 3.0–12.0)
NEUTROS PCT: 60.9 % (ref 43.0–77.0)
Neutro Abs: 2.7 10*3/uL (ref 1.4–7.7)
Platelets: 317 10*3/uL (ref 150.0–400.0)
RBC: 3.97 Mil/uL (ref 3.87–5.11)
RDW: 13.4 % (ref 11.5–15.5)
WBC: 4.5 10*3/uL (ref 4.0–10.5)

## 2017-08-02 LAB — COMPREHENSIVE METABOLIC PANEL
ALBUMIN: 4.6 g/dL (ref 3.5–5.2)
ALK PHOS: 68 U/L (ref 39–117)
ALT: 21 U/L (ref 0–35)
AST: 21 U/L (ref 0–37)
BUN: 12 mg/dL (ref 6–23)
CHLORIDE: 104 meq/L (ref 96–112)
CO2: 27 mEq/L (ref 19–32)
Calcium: 9.5 mg/dL (ref 8.4–10.5)
Creatinine, Ser: 0.7 mg/dL (ref 0.40–1.20)
GFR: 102.44 mL/min (ref >60.00)
GLUCOSE: 105 mg/dL — AB (ref 70–99)
POTASSIUM: 4.1 meq/L (ref 3.5–5.1)
SODIUM: 138 meq/L (ref 135–145)
TOTAL PROTEIN: 7.3 g/dL (ref 6.0–8.3)
Total Bilirubin: 0.3 mg/dL (ref 0.2–1.2)

## 2017-08-02 LAB — IBC PANEL
Iron: 76 ug/dL (ref 42–145)
SATURATION RATIOS: 16.7 % — AB (ref 20.0–50.0)
TRANSFERRIN: 325 mg/dL (ref 212.0–360.0)

## 2017-08-02 LAB — C-REACTIVE PROTEIN: CRP: 0.1 mg/dL — ABNORMAL LOW (ref 0.5–20.0)

## 2017-08-02 LAB — URIC ACID: Uric Acid, Serum: 4.8 mg/dL (ref 2.4–7.0)

## 2017-08-02 LAB — SEDIMENTATION RATE: Sed Rate: 5 mm/hr (ref 0–20)

## 2017-08-02 NOTE — Assessment & Plan Note (Signed)
I believe the patient does have some mild slipped rib syndrome.  Responded fairly well to osteopathic manipulation today.  We discussed icing regimen and home exercises.  Discussed which activities of doing which wants to avoid.  Increase posture and ergonomics.  Follow-up again in 4 to 8 weeks

## 2017-08-02 NOTE — Patient Instructions (Signed)
Good to see you  I am sorry not perfect yet Tried manipulation again as well  Xray and labs downstairs.  See me again in 4-5 weeks

## 2017-08-02 NOTE — Assessment & Plan Note (Signed)
Decision today to treat with OMT was based on Physical Exam  After verbal consent patient was treated with HVLA, ME, FPR techniques in cervical, thoracic, rib,  lumbar and sacral areas  Patient tolerated the procedure well with improvement in symptoms  Patient given exercises, stretches and lifestyle modifications  See medications in patient instructions if given  Patient will follow up in 4-8 weeks 

## 2017-08-02 NOTE — Assessment & Plan Note (Signed)
Patient has had 2 injections.  We discussed the possibility of advanced imaging which patient declined.  X-rays ordered today.

## 2017-08-04 LAB — PTH, INTACT AND CALCIUM
CALCIUM: 9.6 mg/dL (ref 8.6–10.2)
PTH: 40 pg/mL (ref 14–64)

## 2017-08-04 LAB — CYCLIC CITRUL PEPTIDE ANTIBODY, IGG: CYCLIC CITRULLIN PEPTIDE AB: 47 U — AB

## 2017-08-04 LAB — ANGIOTENSIN CONVERTING ENZYME: ANGIOTENSIN-CONVERTING ENZYME: 36 U/L (ref 9–67)

## 2017-08-04 LAB — VITAMIN D 1,25 DIHYDROXY
VITAMIN D 1, 25 (OH) TOTAL: 44 pg/mL (ref 18–72)
VITAMIN D3 1, 25 (OH): 44 pg/mL
Vitamin D2 1, 25 (OH)2: <8 pg/mL

## 2017-08-04 LAB — CALCIUM, IONIZED: CALCIUM ION: 5.4 mg/dL (ref 4.8–5.6)

## 2017-08-04 LAB — RHEUMATOID FACTOR: Rhuematoid fact SerPl-aCnc: <14 IU/mL (ref <14)

## 2017-08-04 LAB — ANA: Anti Nuclear Antibody(ANA): NEGATIVE

## 2017-08-29 NOTE — Progress Notes (Signed)
[facility] [address] [place], [place] [address] Phone: [phone] Subjective:      CC: shoulder pain and neck pain follow up   OZH:YQMVHQIONGHPI:Subjective  Gina Sheffieldmanda Carrozza is a 32 y.o. female coming in with complaint of shoulder pain. Constant, dull pain. Near the ned of her day she has an increase in her pain. Has been using ice. Has had injections before that provided relief.  Patient was given an injection greater than 10 weeks ago.  Started to have worsening pain again.  States that it is unrelenting and not stopping her from work but makes it difficult to fall asleep.  Rates the severity pain is 7 out of 10 and worsening.  Back and neck pain seems to be doing relatively decent but is having worsening pain as well.  Increasing tightness, some increasing stress is likely playing a role.       Past Medical History:  Diagnosis Date  . Anxiety   . Hypertension   . PTSD (post-traumatic stress disorder)    Past Surgical History:  Procedure Laterality Date  . APPENDECTOMY    . CHOLECYSTECTOMY    . TONSILLECTOMY     Social History   Socioeconomic History  . Marital status: Married    Spouse name: Not on file  . Number of children: Not on file  . Years of education: Not on file  . Highest education level: Not on file  Occupational History  . Not on file  Social Needs  . Financial resource strain: Not on file  . Food insecurity:    Worry: Not on file    Inability: Not on file  . Transportation needs:    Medical: Not on file    Non-medical: Not on file  Tobacco Use  . Smoking status: Never Smoker  . Smokeless tobacco: Never Used  Substance and Sexual Activity  . Alcohol use: Yes    Alcohol/week: 2.0 standard drinks    Types: 2 Glasses of wine per week  . Drug use: No  . Sexual activity: Yes    Partners: Male    Birth control/protection: Condom  Lifestyle  . Physical activity:    Days per week: Not on file    Minutes per session: Not on file    . Stress: Not on file  Relationships  . Social connections:    Talks on phone: Not on file    Gets together: Not on file    Attends religious service: Not on file    Active member of club or organization: Not on file    Attends meetings of clubs or organizations: Not on file    Relationship status: Not on file  Other Topics Concern  . Not on file  Social History Narrative   Works as a Engineer, civil (consulting)nurse in a rehab unit   Allergies  Allergen Reactions  . Bee Venom Swelling    swelling  . Gabapentin Swelling  . Silicone Swelling   Family History  Problem Relation Age of Onset  . Hypertension Mother   . Hyperlipidemia Mother   . Hypertension Father   . Diabetes Father   . Diabetes Maternal Grandmother      Past medical history, social, surgical and family history all reviewed in electronic medical record.  No pertanent information unless stated regarding to the chief complaint.   Review of Systems:Review of systems updated and as accurate as of 08/30/17  No headache, visual changes, nausea, vomiting, diarrhea, constipation, dizziness, abdominal pain,  skin rash, fevers, chills, night sweats, weight loss, swollen lymph nodes, body aches, joint swelling, chest pain, shortness of breath, mood changes.  Positive muscle aches  Objective  Blood pressure 118/82, pulse 78, height 5\' 7"  (1.702 m), weight 158 lb (71.7 kg), last menstrual period 08/01/2017, SpO2 98 %. Systems examined below as of 08/30/17   General: No apparent distress alert and oriented x3 mood and affect normal, dressed appropriately.  HEENT: Pupils equal, extraocular movements intact  Respiratory: Patient's speak in full sentences and does not appear short of breath  Cardiovascular: No lower extremity edema, non tender, no erythema  Skin: Warm dry intact with no signs of infection or rash on extremities or on axial skeleton.  Abdomen: Soft nontender  Neuro: Cranial nerves II through XII are intact, neurovascularly intact in  all extremities with 2+ DTRs and 2+ pulses.  Lymph: No lymphadenopathy of posterior or anterior cervical chain or axillae bilaterally.  Gait normal with good balance and coordination.  MSK:  Non tender with full range of motion and good stability and symmetric strength and tone of  elbows, wrist, hip, knee and ankles bilaterally.  Right shoulder exam shows patient does have positive impingement.  4+ out of 5 strength of the rotator cuff compared to the contralateral side with a positive O'Brien's.  No significant crepitus noted.  Neck: Inspection unremarkable. No palpable stepoffs. Negative Spurling's maneuver. Tightness noted in the last 5 degrees of rotation and sidebending bilaterally Grip strength and sensation normal in bilateral hands Strength good C4 to T1 distribution No sensory change to C4 to T1 Negative Hoffman sign bilaterally Reflexes normal  Significant tightness of the trapezius bilaterally right greater than left  Osteopathic findings C2 flexed rotated and side bent right C4 flexed rotated and side bent left C6 flexed rotated and side bent left T3 extended rotated and side bent right inhaled third rib T9 extended rotated and side bent left L2 flexed rotated and side bent right Sacrum right on right  After informed written and verbal consent, patient was seated on exam table. Right shoulder was prepped with alcohol swab and utilizing posterior approach, patient's right glenohumeral space was injected with 4:1  marcaine 0.5%: Kenalog 40mg /dL. Patient tolerated the procedure well without immediate complications.    Impression and Recommendations:     This case required medical decision making of moderate complexity.      Note: This dictation was prepared with Dragon dictation along with smaller phrase technology. Any transcriptional errors that result from this process are unintentional.

## 2017-08-30 ENCOUNTER — Ambulatory Visit: Payer: BLUE CROSS/BLUE SHIELD | Admitting: Family Medicine

## 2017-08-30 ENCOUNTER — Ambulatory Visit: Payer: Self-pay

## 2017-08-30 ENCOUNTER — Encounter: Payer: Self-pay | Admitting: Family Medicine

## 2017-08-30 VITALS — BP 118/82 | HR 78 | Ht 67.0 in | Wt 158.0 lb

## 2017-08-30 DIAGNOSIS — M25511 Pain in right shoulder: Secondary | ICD-10-CM

## 2017-08-30 DIAGNOSIS — M999 Biomechanical lesion, unspecified: Secondary | ICD-10-CM

## 2017-08-30 DIAGNOSIS — G8929 Other chronic pain: Secondary | ICD-10-CM | POA: Diagnosis not present

## 2017-08-30 DIAGNOSIS — M7551 Bursitis of right shoulder: Secondary | ICD-10-CM

## 2017-08-30 NOTE — Assessment & Plan Note (Signed)
Patient has failed more conservative therapy.  Given injection for temporary relief but MRI arthrogram ordered today for further evaluation and treatment.  Discussed icing regimen and home exercises, discussed continued conservative therapy otherwise.

## 2017-08-30 NOTE — Patient Instructions (Addendum)
Good to see you  Gina Baxter is your friend We will get MRI and call [phone] to schedule.  I will write you when I get the results.  See me again in 6-8 weeks

## 2017-08-30 NOTE — Assessment & Plan Note (Signed)
Decision today to treat with OMT was based on Physical Exam  After verbal consent patient was treated with HVLA, ME, FPR techniques in cervical, thoracic, rib, lumbar and sacral areas  Patient tolerated the procedure well with improvement in symptoms  Patient given exercises, stretches and lifestyle modifications  See medications in patient instructions if given  Patient will follow up in 4-6 weeks 

## 2017-09-02 ENCOUNTER — Telehealth: Payer: Self-pay | Admitting: Family Medicine

## 2017-09-02 DIAGNOSIS — M25511 Pain in right shoulder: Secondary | ICD-10-CM

## 2017-09-02 NOTE — Telephone Encounter (Signed)
Copied from CRM [id number]. Topic: Referral - Question >> Sep 02, 2017  3:06 PM Lorayne BenderAlexander, Amber L wrote: Reason for CRM:  [facility].  States they received the orders for a MRI with contrast and that needs to be changed to MRI of the shoulder with and without contrast. Maralyn SagoSarah can be reached at [phone] Fax number [phone]

## 2017-09-05 NOTE — Telephone Encounter (Signed)
Spoke to sarah, order changed to MR Arthrogram with contrast.

## 2017-09-05 NOTE — Telephone Encounter (Signed)
lmovm for Gina Baxter to return call about MRI order.

## 2017-09-07 DIAGNOSIS — S29012A Strain of muscle and tendon of back wall of thorax, initial encounter: Secondary | ICD-10-CM | POA: Diagnosis not present

## 2017-09-12 ENCOUNTER — Encounter: Payer: BLUE CROSS/BLUE SHIELD | Admitting: Family Medicine

## 2017-09-12 DIAGNOSIS — Z0289 Encounter for other administrative examinations: Secondary | ICD-10-CM

## 2017-09-12 NOTE — Progress Notes (Deleted)
Margaretha Sheffieldmanda Rodman DOB: 1985-10-20 Encounter date: 09/12/2017  This isa 32 y.o. female who presents to establish care. No chief complaint on file.   History of present illness: ?Palpitations/htn follow up with Dr. Allyson SabalBerry?***  ***   Past Medical History:  Diagnosis Date  . Anxiety   . Hypertension   . PTSD (post-traumatic stress disorder)    Past Surgical History:  Procedure Laterality Date  . APPENDECTOMY    . CHOLECYSTECTOMY    . TONSILLECTOMY     Allergies  Allergen Reactions  . Bee Venom Swelling    swelling  . Gabapentin Swelling  . Silicone Swelling   No outpatient medications have been marked as taking for the 09/12/17 encounter (Appointment) with Wynn BankerKoberlein, Riyansh Gerstner C, MD.   Social History   Tobacco Use  . Smoking status: Never Smoker  . Smokeless tobacco: Never Used  Substance Use Topics  . Alcohol use: Yes    Alcohol/week: 2.0 standard drinks    Types: 2 Glasses of wine per week   Family History  Problem Relation Age of Onset  . Hypertension Mother   . Hyperlipidemia Mother   . Hypertension Father   . Diabetes Father   . Diabetes Maternal Grandmother      Review of Systems  Objective:  There were no vitals taken for this visit.      BP Readings from Last 3 Encounters:  08/30/17 118/82  08/02/17 124/90  07/01/17 122/88   Wt Readings from Last 3 Encounters:  08/30/17 158 lb (71.7 kg)  08/02/17 157 lb (71.2 kg)  07/01/17 156 lb (70.8 kg)    Physical Exam  Assessment/Plan:  There are no diagnoses linked to this encounter.  No follow-ups on file.  Theodis ShoveJunell Banyan Goodchild, MD

## 2017-09-20 ENCOUNTER — Ambulatory Visit
Admission: RE | Admit: 2017-09-20 | Discharge: 2017-09-20 | Disposition: A | Discharge status: Home / Self Care | Payer: BLUE CROSS/BLUE SHIELD | Source: Ambulatory Visit | Attending: Family Medicine | Admitting: Family Medicine

## 2017-09-20 DIAGNOSIS — M25511 Pain in right shoulder: Secondary | ICD-10-CM

## 2017-09-20 DIAGNOSIS — S43431A Superior glenoid labrum lesion of right shoulder, initial encounter: Secondary | ICD-10-CM | POA: Diagnosis not present

## 2017-09-20 DIAGNOSIS — G8929 Other chronic pain: Secondary | ICD-10-CM

## 2017-09-20 MED ORDER — IOPAMIDOL (ISOVUE-M 200) INJECTION 41%
15.0000 mL | Freq: Once | INTRAMUSCULAR | Status: AC
  Administered 2017-09-20: 15 mL via INTRA_ARTICULAR

## 2017-10-05 ENCOUNTER — Encounter: Payer: Self-pay | Admitting: Family Medicine

## 2017-10-05 DIAGNOSIS — M25311 Other instability, right shoulder: Secondary | ICD-10-CM

## 2017-10-06 DIAGNOSIS — F411 Generalized anxiety disorder: Secondary | ICD-10-CM | POA: Diagnosis not present

## 2017-10-06 DIAGNOSIS — F4312 Post-traumatic stress disorder, chronic: Secondary | ICD-10-CM | POA: Diagnosis not present

## 2017-10-06 DIAGNOSIS — F3342 Major depressive disorder, recurrent, in full remission: Secondary | ICD-10-CM | POA: Diagnosis not present

## 2017-10-06 DIAGNOSIS — F900 Attention-deficit hyperactivity disorder, predominantly inattentive type: Secondary | ICD-10-CM | POA: Diagnosis not present

## 2017-10-10 DIAGNOSIS — J209 Acute bronchitis, unspecified: Secondary | ICD-10-CM | POA: Diagnosis not present

## 2017-10-10 NOTE — Progress Notes (Deleted)
[name] [address] [place], [place] [address] Phone: [phone] Subjective:    I'm seeing this patient by the request  of:    CC:   [id number]  Gina Baxter is a 32 y.o. female coming in with complaint of ***  Onset-  Location Duration-  Character- Aggravating factors- Reliving factors-  Therapies tried-  Severity-     Past Medical History:  Diagnosis Date  . Anxiety   . Hypertension   . PTSD (post-traumatic stress disorder)    Past Surgical History:  Procedure Laterality Date  . APPENDECTOMY    . CHOLECYSTECTOMY    . TONSILLECTOMY     Social History   Socioeconomic History  . Marital status: Married    Spouse name: Not on file  . Number of children: Not on file  . Years of education: Not on file  . Highest education level: Not on file  Occupational History  . Not on file  Social Needs  . Financial resource strain: Not on file  . Food insecurity:    Worry: Not on file    Inability: Not on file  . Transportation needs:    Medical: Not on file    Non-medical: Not on file  Tobacco Use  . Smoking status: Never Smoker  . Smokeless tobacco: Never Used  Substance and Sexual Activity  . Alcohol use: Yes    Alcohol/week: 2.0 standard drinks    Types: 2 Glasses of wine per week  . Drug use: No  . Sexual activity: Yes    Partners: Male    Birth control/protection: Condom  Lifestyle  . Physical activity:    Days per week: Not on file    Minutes per session: Not on file  . Stress: Not on file  Relationships  . Social connections:    Talks on phone: Not on file    Gets together: Not on file    Attends religious service: Not on file    Active member of club or organization: Not on file    Attends meetings of clubs or organizations: Not on file    Relationship status: Not on file  Other Topics Concern  . Not on file  Social History Narrative   Works as a Engineer, civil (consulting) in a rehab unit   Allergies  Allergen Reactions    . Bee Venom Swelling    swelling  . Gabapentin Swelling  . Silicone Swelling   Family History  Problem Relation Age of Onset  . Hypertension Mother   . Hyperlipidemia Mother   . Hypertension Father   . Diabetes Father   . Diabetes Maternal Grandmother     Current Outpatient Medications (Endocrine & Metabolic):  .  Levonorgestrel-Ethinyl Estrad (ALTAVERA PO), Take by mouth daily. .  predniSONE (DELTASONE) 5 MG tablet, Take 6 pills for first day, 5 pills second day, 4 pills third day, 3 pills fourth day, 2 pills the fifth day, and 1 pill sixth day.  Current Outpatient Medications (Cardiovascular):  .  nebivolol (BYSTOLIC) 5 MG tablet, Take 1 tablet (5 mg total) by mouth daily.     Current Outpatient Medications (Other):  .  amphetamine-dextroamphetamine (ADDERALL) 20 MG tablet, Take 20 mg by mouth as directed.  .  busPIRone (BUSPAR) 30 MG tablet, Take 30 mg by mouth 2 (two) times daily. .  clonazePAM (KLONOPIN) 0.5 MG tablet, Take 0.5 mg by mouth daily as needed for anxiety. .  Diclofenac Sodium (PENNSAID) 2 % SOLN, Place 2  application onto the skin 2 (two) times daily. Marland Kitchen.  FLUoxetine (PROZAC) 40 MG capsule, Take 40 mg by mouth daily. Take 2 tabs po daily .  omeprazole (PRILOSEC) 20 MG capsule, Take 1 capsule (20 mg total) by mouth daily. .  traZODone (DESYREL) 50 MG tablet, Take 50 mg by mouth at bedtime as needed for sleep. .  Vitamin D, Ergocalciferol, (DRISDOL) 50000 units CAPS capsule, TAKE 1 CAPSULE BY MOUTH EVERY 7 DAYS    Past medical history, social, surgical and family history all reviewed in electronic medical record.  No pertanent information unless stated regarding to the chief complaint.   Review of Systems:  No headache, visual changes, nausea, vomiting, diarrhea, constipation, dizziness, abdominal pain, skin rash, fevers, chills, night sweats, weight loss, swollen lymph nodes, body aches, joint swelling, muscle aches, chest pain, shortness of breath, mood  changes.   Objective  There were no vitals taken for this visit. Systems examined below as of    General: No apparent distress alert and oriented x3 mood and affect normal, dressed appropriately.  HEENT: Pupils equal, extraocular movements intact  Respiratory: Patient's speak in full sentences and does not appear short of breath  Cardiovascular: No lower extremity edema, non tender, no erythema  Skin: Warm dry intact with no signs of infection or rash on extremities or on axial skeleton.  Abdomen: Soft nontender  Neuro: Cranial nerves II through XII are intact, neurovascularly intact in all extremities with 2+ DTRs and 2+ pulses.  Lymph: No lymphadenopathy of posterior or anterior cervical chain or axillae bilaterally.  Gait normal with good balance and coordination.  MSK:  Non tender with full range of motion and good stability and symmetric strength and tone of shoulders, elbows, wrist, hip, knee and ankles bilaterally.     Impression and Recommendations:     This case required medical decision making of moderate complexity. The above documentation has been reviewed and is accurate and complete Judi SaaZachary M Clayborne Divis, DO       Note: This dictation was prepared with Dragon dictation along with smaller phrase technology. Any transcriptional errors that result from this process are unintentional.

## 2017-10-11 ENCOUNTER — Ambulatory Visit: Payer: BLUE CROSS/BLUE SHIELD | Admitting: Family Medicine

## 2017-10-11 DIAGNOSIS — Z0289 Encounter for other administrative examinations: Secondary | ICD-10-CM

## 2017-10-14 DIAGNOSIS — M67911 Unspecified disorder of synovium and tendon, right shoulder: Secondary | ICD-10-CM | POA: Diagnosis not present

## 2017-10-27 DIAGNOSIS — F411 Generalized anxiety disorder: Secondary | ICD-10-CM | POA: Diagnosis not present

## 2017-10-27 DIAGNOSIS — F4312 Post-traumatic stress disorder, chronic: Secondary | ICD-10-CM | POA: Diagnosis not present

## 2017-10-27 DIAGNOSIS — F330 Major depressive disorder, recurrent, mild: Secondary | ICD-10-CM | POA: Diagnosis not present

## 2017-10-27 DIAGNOSIS — F900 Attention-deficit hyperactivity disorder, predominantly inattentive type: Secondary | ICD-10-CM | POA: Diagnosis not present

## 2017-11-01 DIAGNOSIS — F4312 Post-traumatic stress disorder, chronic: Secondary | ICD-10-CM | POA: Diagnosis not present

## 2017-11-01 DIAGNOSIS — F319 Bipolar disorder, unspecified: Secondary | ICD-10-CM | POA: Diagnosis not present

## 2017-11-01 DIAGNOSIS — F411 Generalized anxiety disorder: Secondary | ICD-10-CM | POA: Diagnosis not present

## 2017-11-01 DIAGNOSIS — F428 Other obsessive-compulsive disorder: Secondary | ICD-10-CM | POA: Diagnosis not present

## 2017-11-03 DIAGNOSIS — F4312 Post-traumatic stress disorder, chronic: Secondary | ICD-10-CM | POA: Diagnosis not present

## 2017-11-03 DIAGNOSIS — F411 Generalized anxiety disorder: Secondary | ICD-10-CM | POA: Diagnosis not present

## 2017-11-03 DIAGNOSIS — F900 Attention-deficit hyperactivity disorder, predominantly inattentive type: Secondary | ICD-10-CM | POA: Diagnosis not present

## 2017-11-03 DIAGNOSIS — F330 Major depressive disorder, recurrent, mild: Secondary | ICD-10-CM | POA: Diagnosis not present

## 2017-11-08 DIAGNOSIS — F900 Attention-deficit hyperactivity disorder, predominantly inattentive type: Secondary | ICD-10-CM | POA: Diagnosis not present

## 2017-11-08 DIAGNOSIS — F4312 Post-traumatic stress disorder, chronic: Secondary | ICD-10-CM | POA: Diagnosis not present

## 2017-11-08 DIAGNOSIS — F411 Generalized anxiety disorder: Secondary | ICD-10-CM | POA: Diagnosis not present

## 2017-11-15 DIAGNOSIS — F4312 Post-traumatic stress disorder, chronic: Secondary | ICD-10-CM | POA: Diagnosis not present

## 2017-11-15 DIAGNOSIS — F4001 Agoraphobia with panic disorder: Secondary | ICD-10-CM | POA: Diagnosis not present

## 2017-11-15 DIAGNOSIS — F411 Generalized anxiety disorder: Secondary | ICD-10-CM | POA: Diagnosis not present

## 2017-11-15 DIAGNOSIS — F900 Attention-deficit hyperactivity disorder, predominantly inattentive type: Secondary | ICD-10-CM | POA: Diagnosis not present

## 2017-11-22 DIAGNOSIS — M7541 Impingement syndrome of right shoulder: Secondary | ICD-10-CM | POA: Diagnosis not present

## 2017-11-22 DIAGNOSIS — G8918 Other acute postprocedural pain: Secondary | ICD-10-CM | POA: Diagnosis not present

## 2017-11-22 DIAGNOSIS — M19011 Primary osteoarthritis, right shoulder: Secondary | ICD-10-CM | POA: Diagnosis not present

## 2017-11-22 DIAGNOSIS — M7551 Bursitis of right shoulder: Secondary | ICD-10-CM | POA: Diagnosis not present

## 2017-11-22 DIAGNOSIS — M75111 Incomplete rotator cuff tear or rupture of right shoulder, not specified as traumatic: Secondary | ICD-10-CM | POA: Diagnosis not present

## 2017-11-30 DIAGNOSIS — M19011 Primary osteoarthritis, right shoulder: Secondary | ICD-10-CM | POA: Diagnosis not present

## 2017-12-05 DIAGNOSIS — F900 Attention-deficit hyperactivity disorder, predominantly inattentive type: Secondary | ICD-10-CM | POA: Diagnosis not present

## 2017-12-05 DIAGNOSIS — F4001 Agoraphobia with panic disorder: Secondary | ICD-10-CM | POA: Diagnosis not present

## 2017-12-05 DIAGNOSIS — F319 Bipolar disorder, unspecified: Secondary | ICD-10-CM | POA: Diagnosis not present

## 2017-12-05 DIAGNOSIS — F411 Generalized anxiety disorder: Secondary | ICD-10-CM | POA: Diagnosis not present

## 2017-12-13 DIAGNOSIS — F4001 Agoraphobia with panic disorder: Secondary | ICD-10-CM | POA: Diagnosis not present

## 2017-12-13 DIAGNOSIS — F4312 Post-traumatic stress disorder, chronic: Secondary | ICD-10-CM | POA: Diagnosis not present

## 2017-12-13 DIAGNOSIS — F411 Generalized anxiety disorder: Secondary | ICD-10-CM | POA: Diagnosis not present

## 2017-12-13 DIAGNOSIS — F3132 Bipolar disorder, current episode depressed, moderate: Secondary | ICD-10-CM | POA: Diagnosis not present

## 2017-12-14 DIAGNOSIS — F4312 Post-traumatic stress disorder, chronic: Secondary | ICD-10-CM | POA: Diagnosis not present

## 2017-12-14 DIAGNOSIS — F900 Attention-deficit hyperactivity disorder, predominantly inattentive type: Secondary | ICD-10-CM | POA: Diagnosis not present

## 2017-12-14 DIAGNOSIS — F411 Generalized anxiety disorder: Secondary | ICD-10-CM | POA: Diagnosis not present

## 2017-12-27 DIAGNOSIS — F4001 Agoraphobia with panic disorder: Secondary | ICD-10-CM | POA: Diagnosis not present

## 2017-12-27 DIAGNOSIS — F422 Mixed obsessional thoughts and acts: Secondary | ICD-10-CM | POA: Diagnosis not present

## 2017-12-27 DIAGNOSIS — F411 Generalized anxiety disorder: Secondary | ICD-10-CM | POA: Diagnosis not present

## 2017-12-27 DIAGNOSIS — F3177 Bipolar disorder, in partial remission, most recent episode mixed: Secondary | ICD-10-CM | POA: Diagnosis not present

## 2017-12-30 DIAGNOSIS — F411 Generalized anxiety disorder: Secondary | ICD-10-CM | POA: Diagnosis not present

## 2017-12-30 DIAGNOSIS — F900 Attention-deficit hyperactivity disorder, predominantly inattentive type: Secondary | ICD-10-CM | POA: Diagnosis not present

## 2017-12-30 DIAGNOSIS — F4312 Post-traumatic stress disorder, chronic: Secondary | ICD-10-CM | POA: Diagnosis not present

## 2018-01-24 DIAGNOSIS — F900 Attention-deficit hyperactivity disorder, predominantly inattentive type: Secondary | ICD-10-CM | POA: Diagnosis not present

## 2018-01-24 DIAGNOSIS — F411 Generalized anxiety disorder: Secondary | ICD-10-CM | POA: Diagnosis not present

## 2018-01-24 DIAGNOSIS — F4312 Post-traumatic stress disorder, chronic: Secondary | ICD-10-CM | POA: Diagnosis not present

## 2018-01-24 DIAGNOSIS — F3176 Bipolar disorder, in full remission, most recent episode depressed: Secondary | ICD-10-CM | POA: Diagnosis not present

## 2018-01-25 IMAGING — CR DG CHEST 2V
2 series · 2 of 2 positions shown · non-contrast
Comparison: None.

CLINICAL DATA: Acute onset stabbing chest pain at 6877 hrs today.
Slight headache. Ex smoker. Htn. No Jonsbeyker. Hx bronchitis.

EXAM:
CHEST  2 VIEW

[w chest pa]
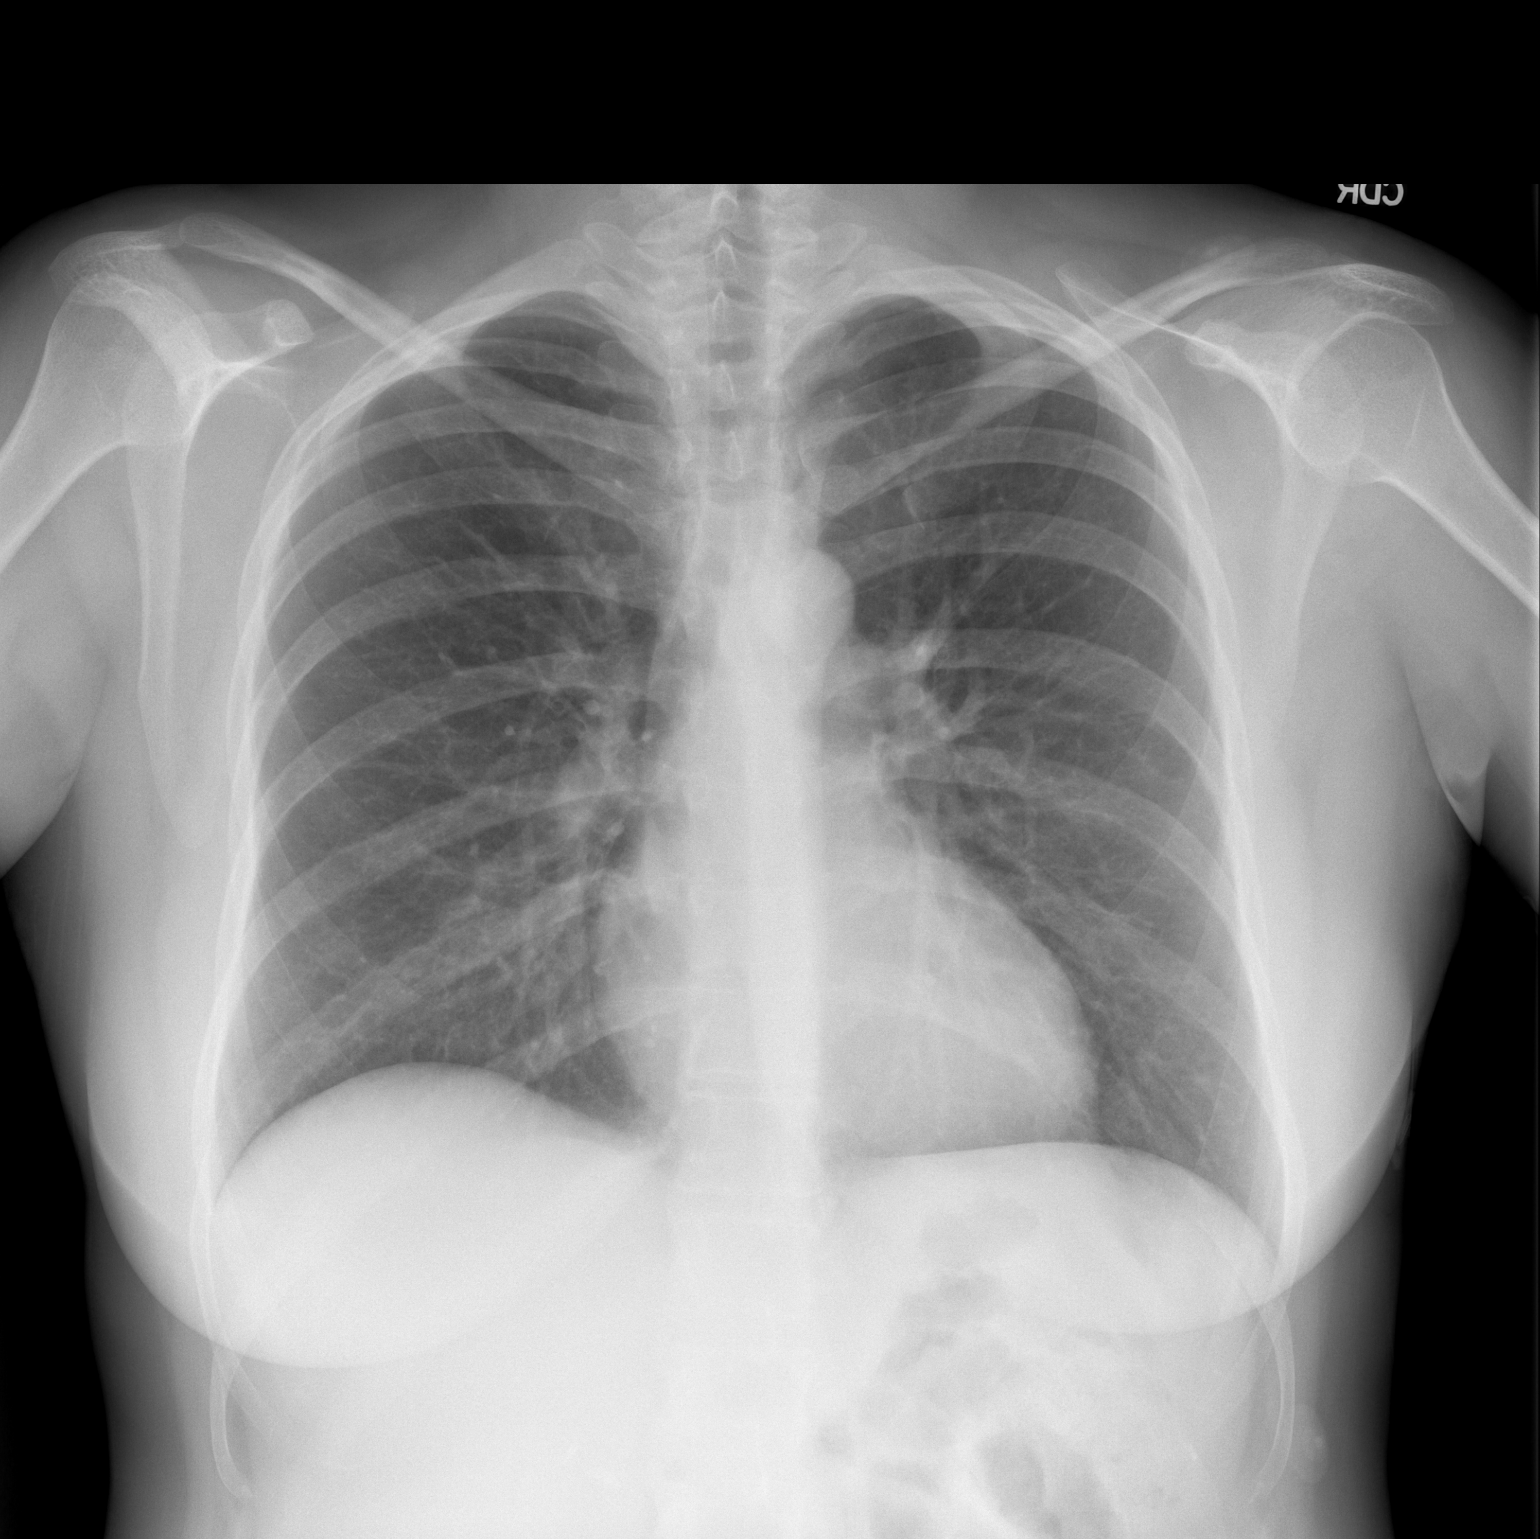

[w chest lat]
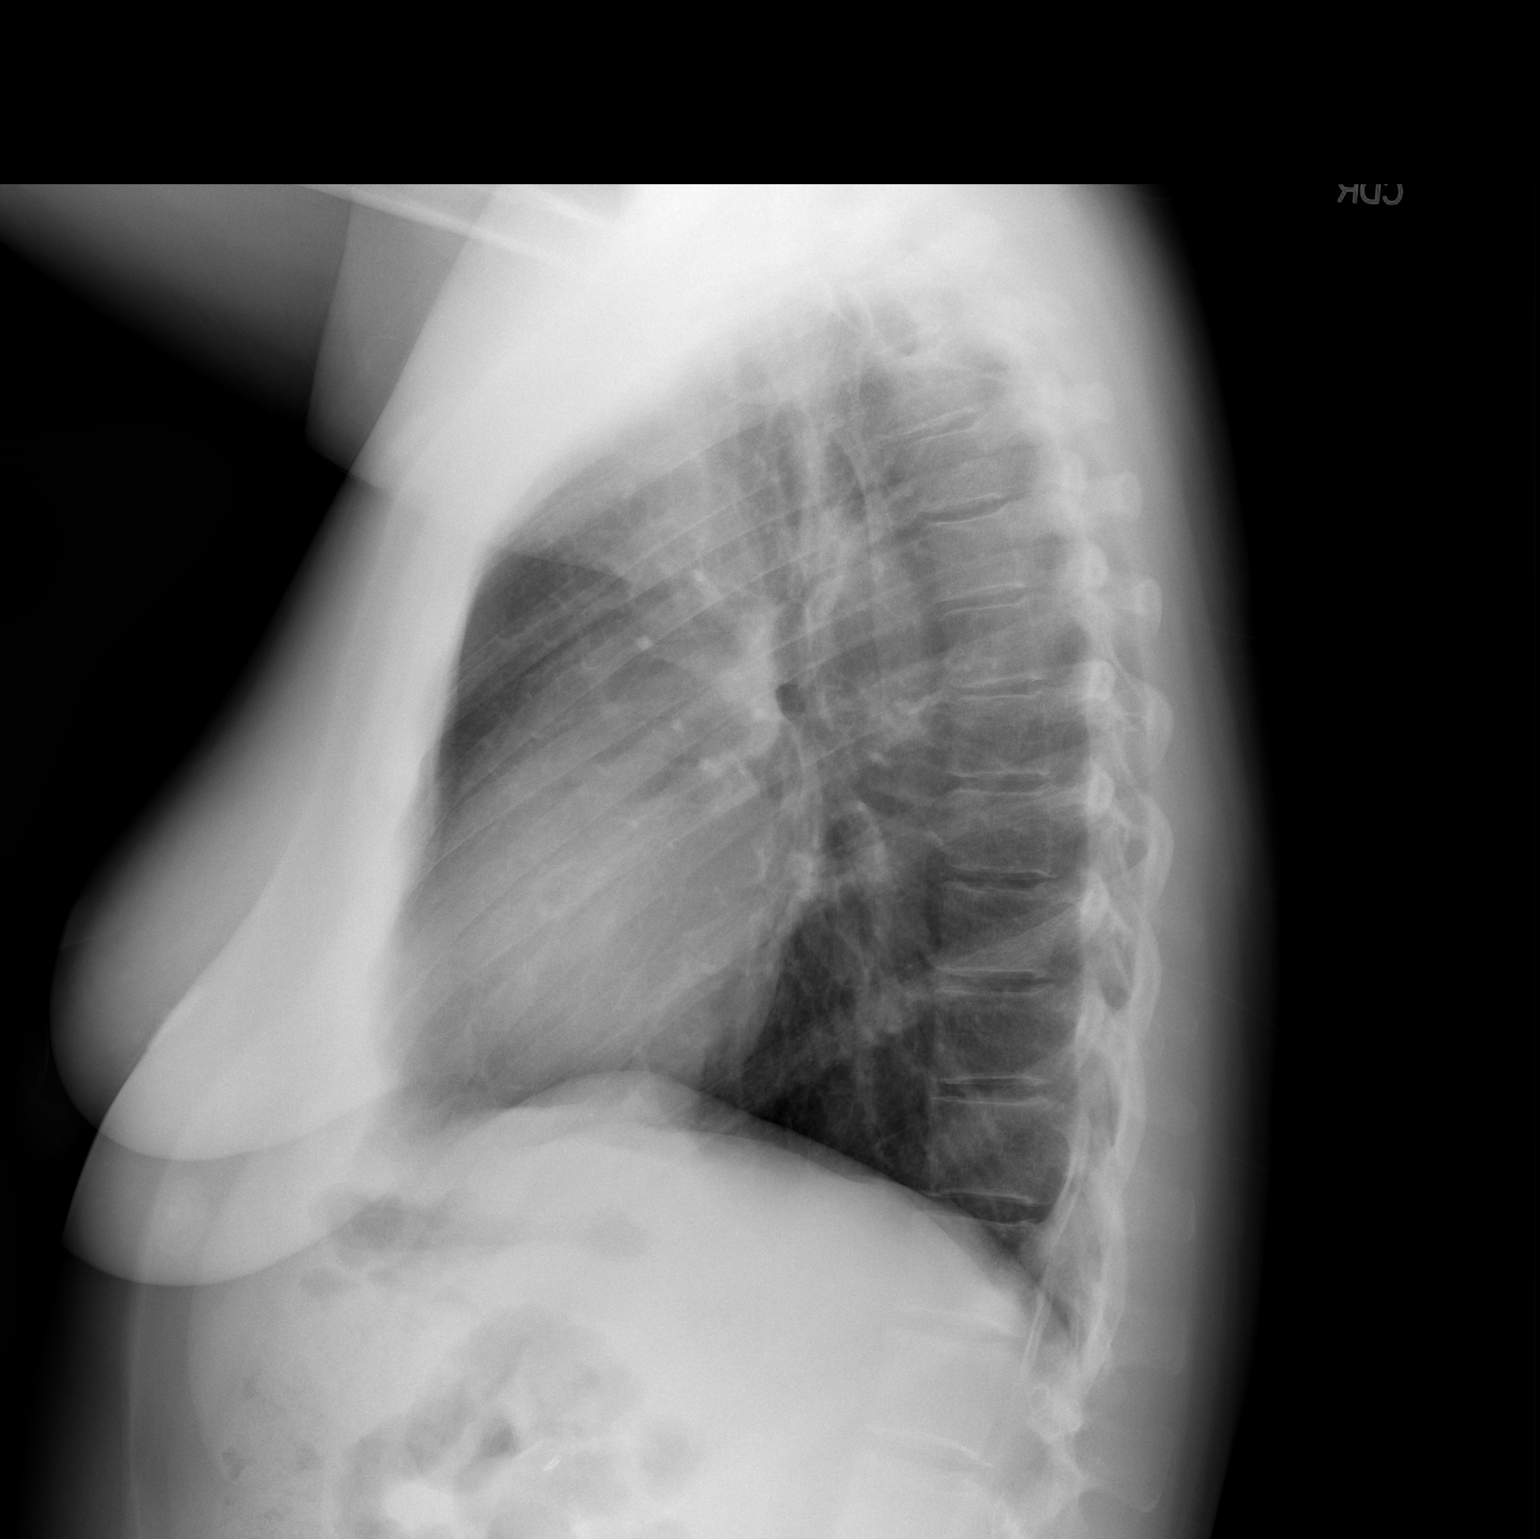

[2 of 2 positions shown; findings below may reference images not displayed]

FINDINGS: Normal mediastinum and cardiac silhouette. Normal pulmonary
vasculature. No evidence of effusion, infiltrate, or pneumothorax.
No acute bony abnormality.
IMPRESSION: No acute cardiopulmonary process.

## 2018-02-27 DIAGNOSIS — R300 Dysuria: Secondary | ICD-10-CM | POA: Diagnosis not present

## 2018-04-03 DIAGNOSIS — F411 Generalized anxiety disorder: Secondary | ICD-10-CM | POA: Diagnosis not present

## 2018-04-03 DIAGNOSIS — F4001 Agoraphobia with panic disorder: Secondary | ICD-10-CM | POA: Diagnosis not present

## 2018-04-03 DIAGNOSIS — F4312 Post-traumatic stress disorder, chronic: Secondary | ICD-10-CM | POA: Diagnosis not present

## 2018-04-03 DIAGNOSIS — F428 Other obsessive-compulsive disorder: Secondary | ICD-10-CM | POA: Diagnosis not present

## 2018-06-26 DIAGNOSIS — F4312 Post-traumatic stress disorder, chronic: Secondary | ICD-10-CM | POA: Diagnosis not present

## 2018-06-26 DIAGNOSIS — F428 Other obsessive-compulsive disorder: Secondary | ICD-10-CM | POA: Diagnosis not present

## 2018-06-26 DIAGNOSIS — F4001 Agoraphobia with panic disorder: Secondary | ICD-10-CM | POA: Diagnosis not present

## 2018-06-26 DIAGNOSIS — F411 Generalized anxiety disorder: Secondary | ICD-10-CM | POA: Diagnosis not present

## 2018-09-18 DIAGNOSIS — F4001 Agoraphobia with panic disorder: Secondary | ICD-10-CM | POA: Diagnosis not present

## 2018-09-18 DIAGNOSIS — F428 Other obsessive-compulsive disorder: Secondary | ICD-10-CM | POA: Diagnosis not present

## 2018-09-18 DIAGNOSIS — F411 Generalized anxiety disorder: Secondary | ICD-10-CM | POA: Diagnosis not present

## 2018-09-18 DIAGNOSIS — F4312 Post-traumatic stress disorder, chronic: Secondary | ICD-10-CM | POA: Diagnosis not present

## 2018-10-17 DIAGNOSIS — F411 Generalized anxiety disorder: Secondary | ICD-10-CM | POA: Diagnosis not present

## 2018-10-17 DIAGNOSIS — F4001 Agoraphobia with panic disorder: Secondary | ICD-10-CM | POA: Diagnosis not present

## 2018-10-17 DIAGNOSIS — F4312 Post-traumatic stress disorder, chronic: Secondary | ICD-10-CM | POA: Diagnosis not present

## 2018-10-17 DIAGNOSIS — F428 Other obsessive-compulsive disorder: Secondary | ICD-10-CM | POA: Diagnosis not present

## 2018-11-14 DIAGNOSIS — F4001 Agoraphobia with panic disorder: Secondary | ICD-10-CM | POA: Diagnosis not present

## 2018-11-14 DIAGNOSIS — F4312 Post-traumatic stress disorder, chronic: Secondary | ICD-10-CM | POA: Diagnosis not present

## 2018-11-14 DIAGNOSIS — F428 Other obsessive-compulsive disorder: Secondary | ICD-10-CM | POA: Diagnosis not present

## 2018-11-14 DIAGNOSIS — F411 Generalized anxiety disorder: Secondary | ICD-10-CM | POA: Diagnosis not present

## 2019-01-12 DIAGNOSIS — R42 Dizziness and giddiness: Secondary | ICD-10-CM | POA: Diagnosis not present

## 2019-01-12 DIAGNOSIS — Z888 Allergy status to other drugs, medicaments and biological substances status: Secondary | ICD-10-CM | POA: Diagnosis not present

## 2019-01-12 DIAGNOSIS — Z87891 Personal history of nicotine dependence: Secondary | ICD-10-CM | POA: Diagnosis not present

## 2019-01-12 DIAGNOSIS — N3000 Acute cystitis without hematuria: Secondary | ICD-10-CM | POA: Diagnosis not present

## 2019-01-12 DIAGNOSIS — Z91030 Bee allergy status: Secondary | ICD-10-CM | POA: Diagnosis not present

## 2019-01-12 DIAGNOSIS — Z79899 Other long term (current) drug therapy: Secondary | ICD-10-CM | POA: Diagnosis not present

## 2019-01-12 DIAGNOSIS — Z91048 Other nonmedicinal substance allergy status: Secondary | ICD-10-CM | POA: Diagnosis not present

## 2019-01-12 DIAGNOSIS — R112 Nausea with vomiting, unspecified: Secondary | ICD-10-CM | POA: Diagnosis not present

## 2019-07-21 IMAGING — DX DG SHOULDER 2+V*R*
3 series · 3 of 3 positions shown · non-contrast
Comparison: None.

CLINICAL DATA: 31-year-old female with chronic right shoulder pain
for years worsening over the past 2 months. No known injury. Initial
encounter.

EXAM:
RIGHT SHOULDER - 2+ VIEW

[grashey]
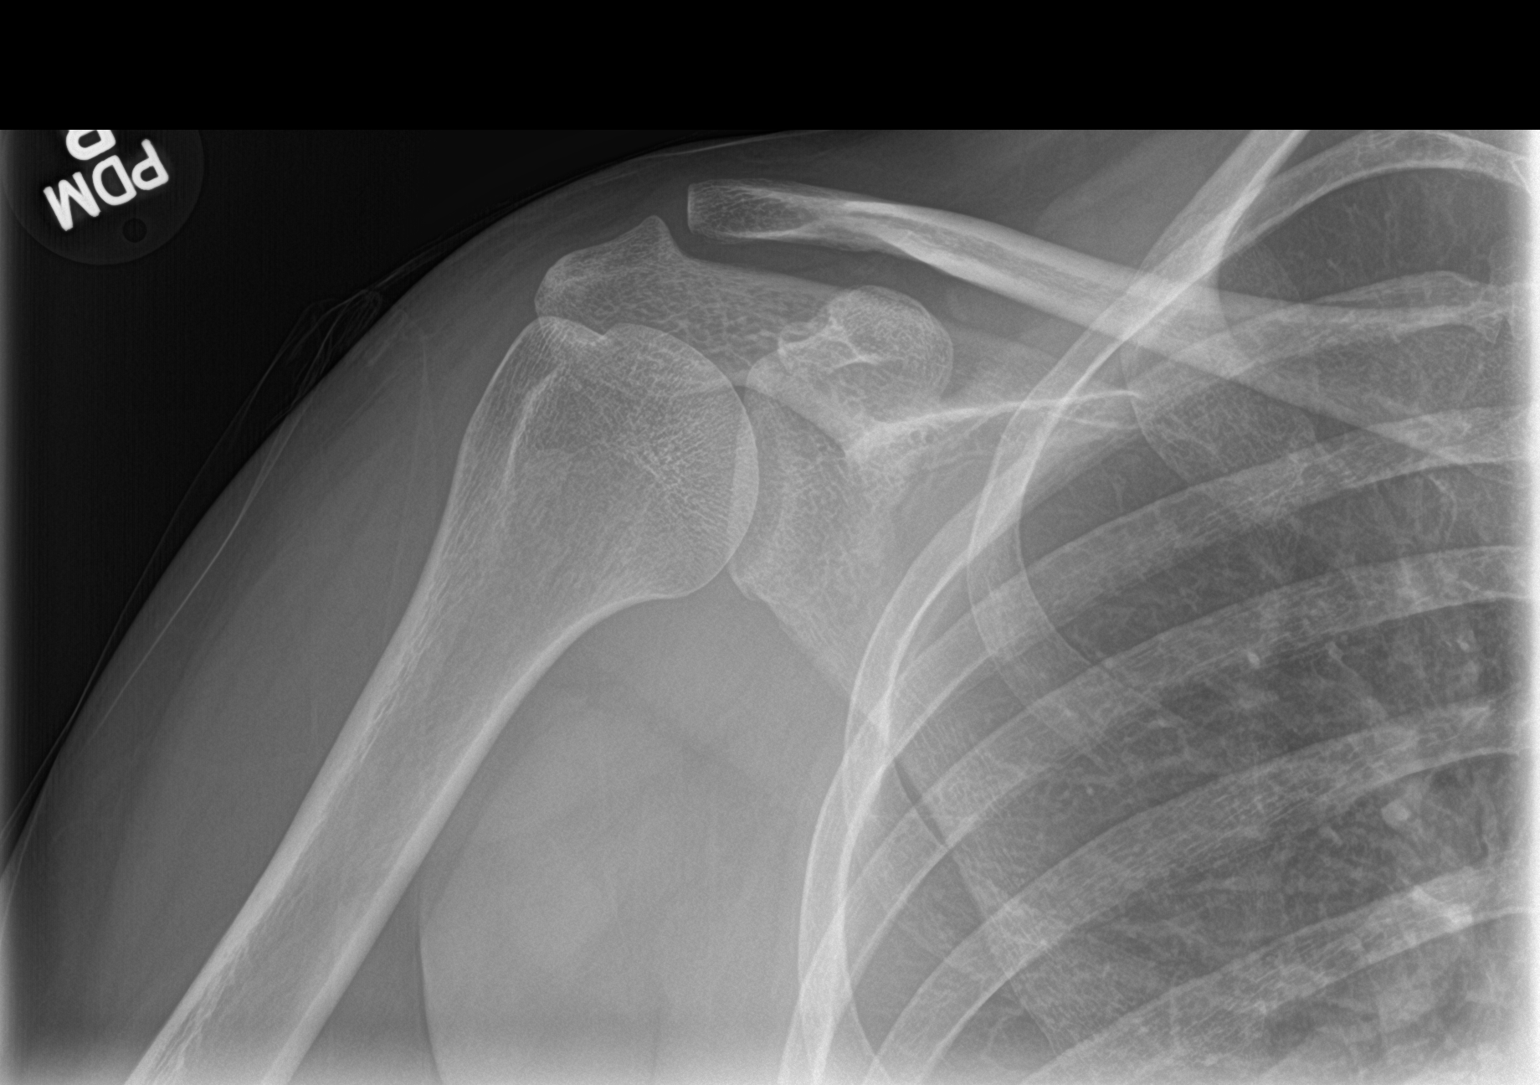

[y view]
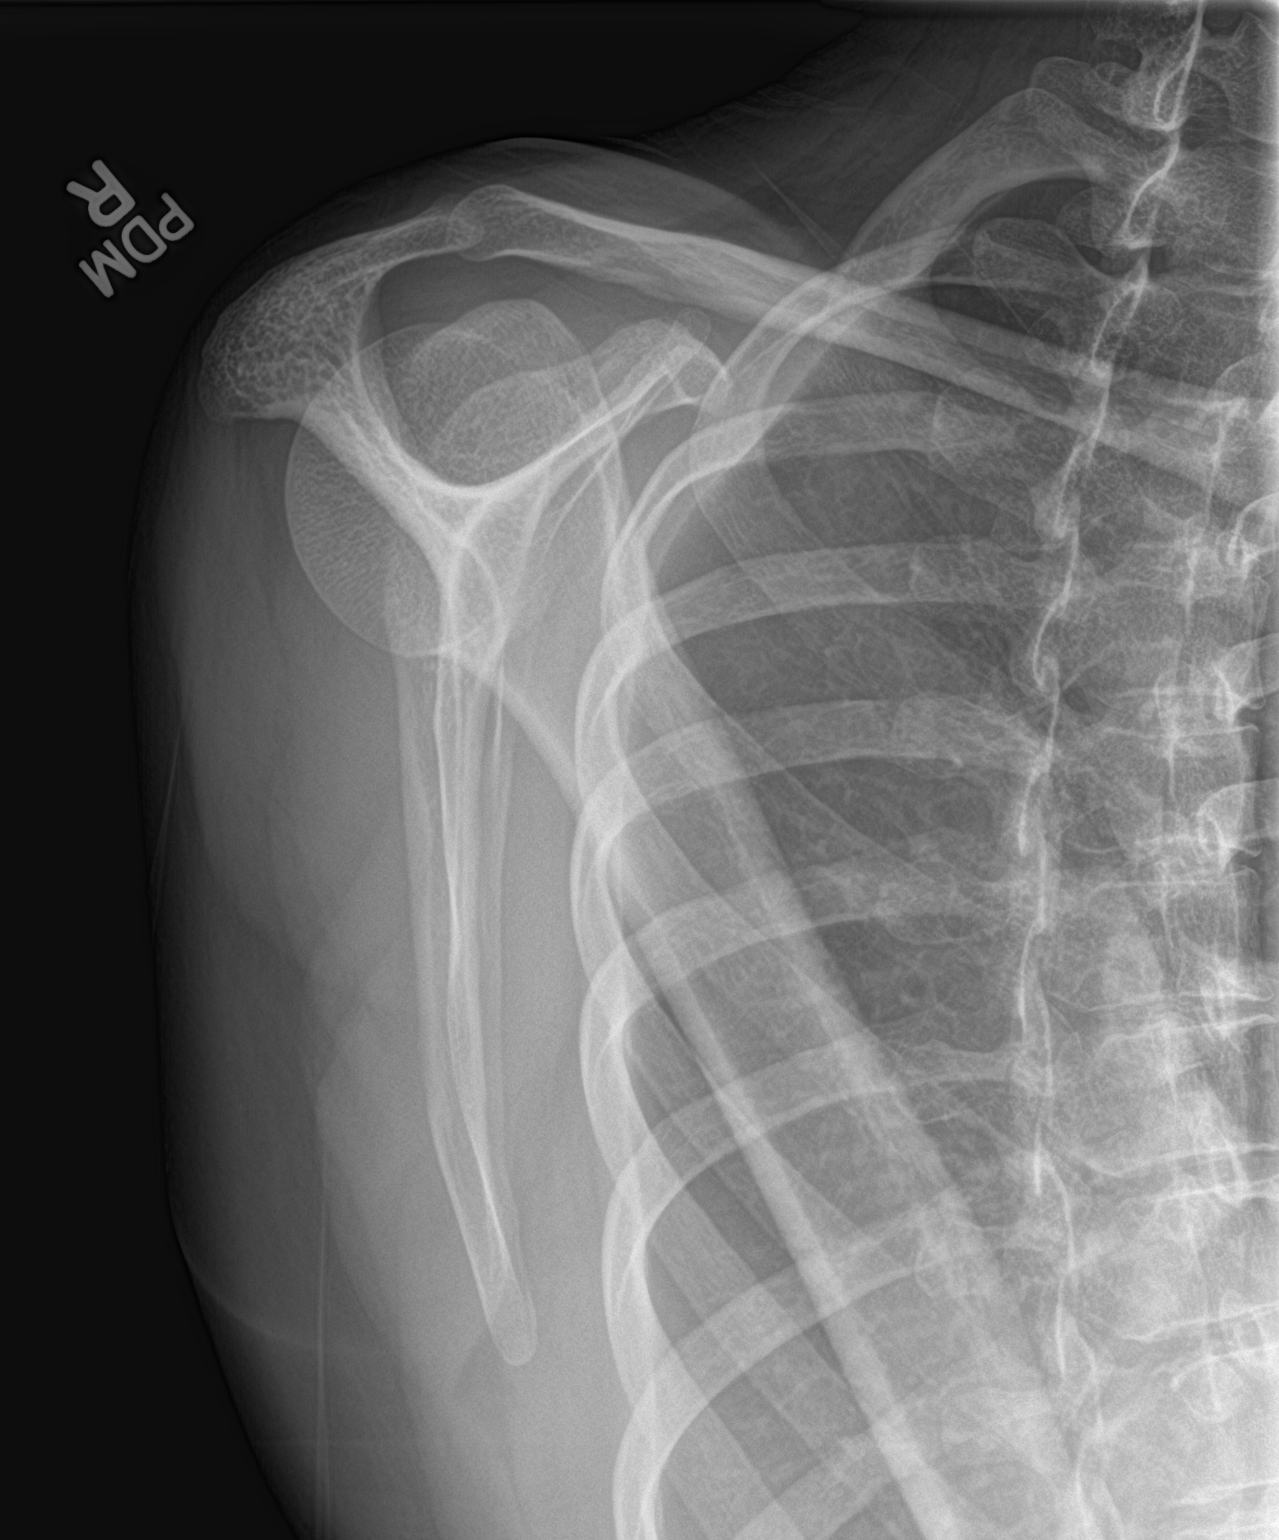

[shoulder axial]
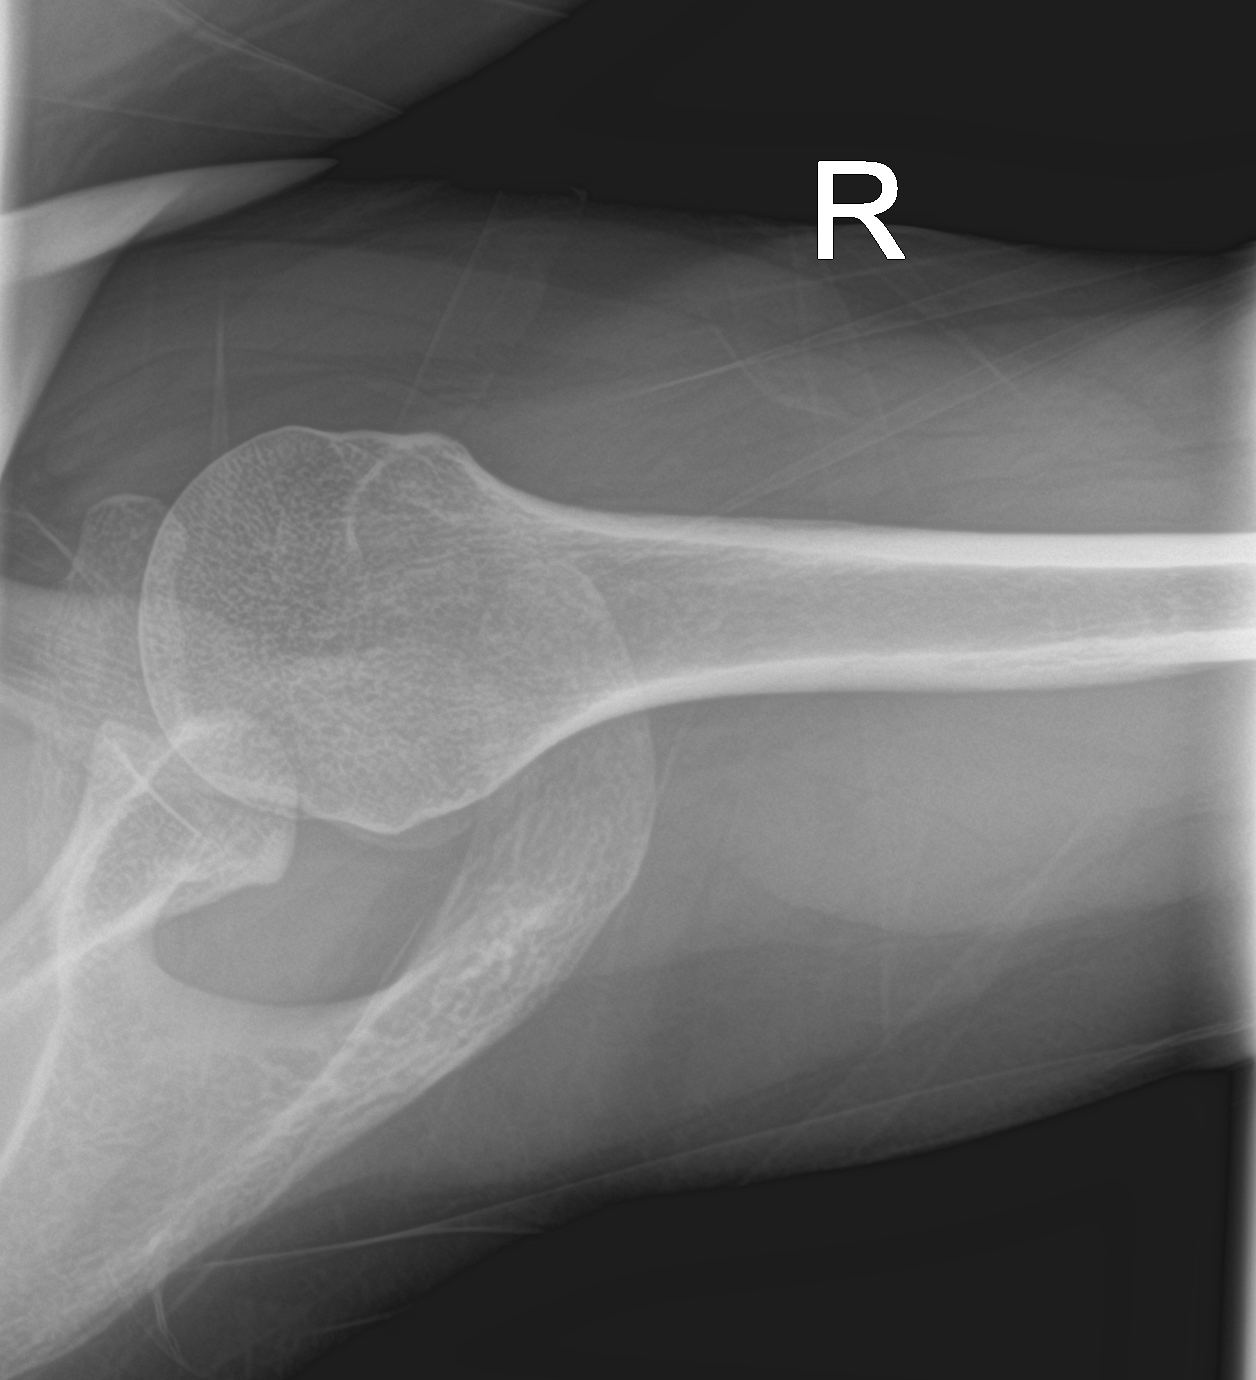

[3 of 3 positions shown; findings below may reference images not displayed]

FINDINGS: No fracture or dislocation.

No significant degenerative changes.

Slight elevation distal clavicle on one view probably projectional.

No abnormal soft tissue calcifications.

Visualized lungs clear.
IMPRESSION: Negative.
